# Patient Record
Sex: Female | Born: 1935 | Race: Black or African American | Hispanic: No | Smoking: Never smoker
Health system: Southern US, Community
[De-identification: ages and names within clinical notes are randomized; demographics above are authoritative.]

## PROBLEM LIST (undated history)

## (undated) DIAGNOSIS — C189 Malignant neoplasm of colon, unspecified: Secondary | ICD-10-CM

## (undated) DIAGNOSIS — R001 Bradycardia, unspecified: Secondary | ICD-10-CM

## (undated) DIAGNOSIS — N184 Chronic kidney disease, stage 4 (severe): Secondary | ICD-10-CM

## (undated) DIAGNOSIS — M109 Gout, unspecified: Secondary | ICD-10-CM

## (undated) DIAGNOSIS — Z950 Presence of cardiac pacemaker: Secondary | ICD-10-CM

## (undated) DIAGNOSIS — Z515 Encounter for palliative care: Secondary | ICD-10-CM

## (undated) DIAGNOSIS — K219 Gastro-esophageal reflux disease without esophagitis: Secondary | ICD-10-CM

## (undated) DIAGNOSIS — I5022 Chronic systolic (congestive) heart failure: Secondary | ICD-10-CM

## (undated) DIAGNOSIS — I2510 Atherosclerotic heart disease of native coronary artery without angina pectoris: Secondary | ICD-10-CM

## (undated) DIAGNOSIS — D509 Iron deficiency anemia, unspecified: Secondary | ICD-10-CM

## (undated) HISTORY — PX: PARTIAL COLECTOMY: SHX5273

## (undated) HISTORY — PX: ABDOMINAL HYSTERECTOMY: SHX81

---

## 2010-03-05 ENCOUNTER — Ambulatory Visit: Payer: Self-pay | Admitting: Cardiology

## 2010-03-05 ENCOUNTER — Inpatient Hospital Stay (HOSPITAL_COMMUNITY): Admission: EM | Admit: 2010-03-05 | Discharge: 2010-03-10 | Payer: Self-pay | Admitting: Emergency Medicine

## 2010-03-07 ENCOUNTER — Encounter: Payer: Self-pay | Admitting: Cardiology

## 2010-03-09 ENCOUNTER — Encounter: Payer: Self-pay | Admitting: Cardiology

## 2010-03-11 ENCOUNTER — Encounter: Payer: Self-pay | Admitting: Cardiology

## 2010-03-17 ENCOUNTER — Telehealth: Payer: Self-pay | Admitting: Cardiology

## 2010-03-27 ENCOUNTER — Encounter: Payer: Self-pay | Admitting: Cardiology

## 2010-04-08 ENCOUNTER — Encounter: Payer: Self-pay | Admitting: Cardiology

## 2010-05-10 ENCOUNTER — Encounter: Payer: Self-pay | Admitting: Cardiology

## 2010-05-23 ENCOUNTER — Inpatient Hospital Stay (HOSPITAL_COMMUNITY): Admission: EM | Admit: 2010-05-23 | Discharge: 2010-05-27 | Payer: Self-pay | Admitting: Emergency Medicine

## 2010-05-23 ENCOUNTER — Ambulatory Visit: Payer: Self-pay | Admitting: Vascular Surgery

## 2010-05-23 ENCOUNTER — Encounter (INDEPENDENT_AMBULATORY_CARE_PROVIDER_SITE_OTHER): Payer: Self-pay | Admitting: Emergency Medicine

## 2010-06-01 ENCOUNTER — Inpatient Hospital Stay (HOSPITAL_COMMUNITY): Admission: EM | Admit: 2010-06-01 | Discharge: 2010-06-07 | Payer: Self-pay | Admitting: Emergency Medicine

## 2010-06-03 ENCOUNTER — Ambulatory Visit: Payer: Self-pay | Admitting: Surgery

## 2010-06-03 ENCOUNTER — Encounter (INDEPENDENT_AMBULATORY_CARE_PROVIDER_SITE_OTHER): Payer: Self-pay | Admitting: Internal Medicine

## 2010-07-09 ENCOUNTER — Ambulatory Visit: Payer: Self-pay | Admitting: Cardiology

## 2010-07-09 ENCOUNTER — Inpatient Hospital Stay (HOSPITAL_COMMUNITY): Admission: EM | Admit: 2010-07-09 | Discharge: 2010-07-13 | Payer: Self-pay | Admitting: Emergency Medicine

## 2010-07-09 ENCOUNTER — Ambulatory Visit: Payer: Self-pay | Admitting: Internal Medicine

## 2010-07-11 ENCOUNTER — Ambulatory Visit: Payer: Self-pay | Admitting: Vascular Surgery

## 2010-07-11 ENCOUNTER — Encounter: Payer: Self-pay | Admitting: Internal Medicine

## 2010-07-15 ENCOUNTER — Encounter: Payer: Self-pay | Admitting: Cardiology

## 2010-07-25 ENCOUNTER — Telehealth: Payer: Self-pay | Admitting: Cardiology

## 2010-07-29 ENCOUNTER — Telehealth: Payer: Self-pay | Admitting: Cardiology

## 2010-08-02 ENCOUNTER — Encounter: Payer: Self-pay | Admitting: Cardiology

## 2010-08-03 ENCOUNTER — Telehealth: Payer: Self-pay | Admitting: Cardiology

## 2010-08-09 ENCOUNTER — Encounter: Payer: Self-pay | Admitting: Cardiology

## 2010-08-10 ENCOUNTER — Telehealth: Payer: Self-pay | Admitting: Cardiology

## 2010-08-24 DIAGNOSIS — I2510 Atherosclerotic heart disease of native coronary artery without angina pectoris: Secondary | ICD-10-CM

## 2010-08-25 ENCOUNTER — Encounter: Payer: Self-pay | Admitting: Cardiology

## 2010-08-26 ENCOUNTER — Ambulatory Visit: Payer: Self-pay | Admitting: Cardiology

## 2010-08-26 DIAGNOSIS — I498 Other specified cardiac arrhythmias: Secondary | ICD-10-CM | POA: Insufficient documentation

## 2010-08-26 DIAGNOSIS — I10 Essential (primary) hypertension: Secondary | ICD-10-CM

## 2010-08-26 DIAGNOSIS — R609 Edema, unspecified: Secondary | ICD-10-CM

## 2010-08-30 ENCOUNTER — Inpatient Hospital Stay (HOSPITAL_COMMUNITY)
Admission: EM | Admit: 2010-08-30 | Discharge: 2010-09-08 | Payer: Self-pay | Source: Home / Self Care | Admitting: Emergency Medicine

## 2010-08-30 ENCOUNTER — Ambulatory Visit: Payer: Self-pay | Admitting: Cardiology

## 2010-11-25 ENCOUNTER — Ambulatory Visit (HOSPITAL_COMMUNITY)
Admission: RE | Admit: 2010-11-25 | Discharge: 2010-11-25 | Payer: Self-pay | Source: Home / Self Care | Attending: Internal Medicine | Admitting: Internal Medicine

## 2011-01-05 NOTE — Discharge Summary (Addendum)
Laura Houston, Laura Houston NO.:  [id number]  MEDICAL RECORD NO.:  [id number]           PATIENT TYPE:  LOCATION:                                 FACILITY:  PHYSICIAN:  Minus Breeding, MD, FACCDATE OF BIRTH:  1923-05-23  DATE OF ADMISSION:  03/05/2010 DATE OF DISCHARGE:  03/10/2010                              DISCHARGE SUMMARY   PRIMARY CARDIOLOGIST:  Minus Breeding, MD, [facility]  PRIMARY CARE PHYSICIAN:  Nadeen Landau, MD  DISCHARGE DIAGNOSES: 1. Coronary artery disease.     a.     Cardiac catheterization, January 09, 2010:  Coronary artery      disease with chronic total occlusion of the mid left anterior      descending, 70-80% stenosis of the first marginal branch of the      circumflex artery, chronic total occlusion of the second marginal      branch of the circumflex artery, 30% proximal and 30% distal      stenosis in the right coronary artery.  Significant CAD, but well      collateralized and lesions are not favorable for PCI.  Medical      therapy recommended.     b.     A 2-D echocardiogram March 07, 2010:  Abnormal septal motion      and mildly dilated left ventricular cavity with mild left      ventricular hypertrophy and normal systolic function with left      ventricular ejection fraction 55%.  Moderate calcification of the      aortic valve between the left and noncoronary commissure, mild      regurgitation as well.  Mild dilatation of the aorta/sinus and      Valsalva and aortic root.  Mildly dilated left atrium.  Atrial      septal aneurysm not well visualized.  SECONDARY DIAGNOSES: 1. Gout. 2. Hypertension for years. 3. Gastroesophageal reflux disease. 4. Nervous/depression. 5. History of colon cancer.     a.     Status post hemicolectomy.     b.     Status post hysterectomy.     c.     Status post cervical spine surgery.  ALLERGIES AND INTOLERANCES: 1. PENICILLIN (nausea/vomiting). 2. SULFA (rash).  PROCEDURE: 1. EKG, March 05, 2010:  NSR, question old inferior infarct, LAD, poor     anterior R-wave progression but no acute ST-T-wave changes. 2. Chest x-ray, March 05, 2010:  Cardiomegaly.  No active disease. 3. CT angiography of the chest with contrast, March 05, 2010:  No     evidence of pulmonary embolus.  Cardiomegaly, CAD.  Question of     biliary ductal dilatation.  Right base atelectasis. 4. A 2-D echocardiogram, March 07, 2010:  Please see discharge     diagnoses, #1 subsection B. 5. Cardiac catheterization, March 09, 2010:  Please see discharge     diagnoses section, #1 subsection A.  HISTORY OF PRESENT ILLNESS:  [name] is a lovely 77 year old African American female with no prior cardiac history but history as outlined above who was evaluated by attending cardiologist, Dr. Minus Breeding on March 05, 2010 in the emergency department after acute shortness of breath.  The patient was in her usual state of health until she was watching TV with her family (she is visiting from out of town) when she noticed acute onset of shortness of breath.  She did not describe any chest pressure, neck or arm discomfort.  She also denies PND and orthopnea. She has had a mild nonproductive cough but no fevers or chills.  She denies any lower extremity edema.  Because of her complaints, she was brought to [facility] ED for further evaluation.  Cardiac enzymes were minimally elevated with CK of 181, MB of 8.4 and fraction of 4.6 and troponin-I of 0.09.  D-dimer elevated at 2.55 but CT of the chest showed no pulmonary embolism.  There was tortuosity of the thoracic aorta, coronary artery complications, and there was a question of biliary ductal dilatation.  Otherwise the patient is doing fairly well and she lives on her own with nearby family.  She still drives and ambulates to the grocery store. She has not been having any chest discomfort, neck or arm discomfort. She denies palpitations, pre/syncope, PND,  orthopnea.  HOSPITAL COURSE:  The patient was admitted and cardiac enzymes were minimally elevated and the decision was made by Dr. Percival Spanish that the patient should have a 2-D echocardiogram and if there was abnormal wall motion, should proceed with diagnostic left heart catheterization.  The patient did have a 2-D echocardiogram completed on the 28th and it did show abnormal septal motion.  It was determination then that the recommendation for cardiac catheterization should be made.  The patient and her family wanted to think about this and on the evening of March 08, 2010, they agreed to proceed with diagnostic LHC.  The patient was hydrated in anticipation of cardiac catheterization which was completed on March 09, 2010.  She tolerated this procedure well without significant complications.  She was noted to have significant coronary artery disease as outlined in the discharge diagnoses section, #1 subsection A but as described above was not favorable to interventions and therefore medical management is going to be pursued.  She was kept overnight for observation and seen by her new, primary cardiologist, Dr. Minus Breeding.  He deemed her stable for discharge and made some adjustments to her already nearly optimal medical management inpatient. She was instructed to follow up with her primary care [name] as well as given the option to set up followup appointment at [facility] office in the future.  At the time of discharge, the patient was given her new medication list, prescriptions, followup instructions, and post cath instructions.  All of her questions and concerns were addressed prior to leaving the hospital.  DISCHARGE LABS:  WBC 2.6, HGB 11.1, HCT 33.4, PLT count is 173,000. Sodium 135, potassium 4.3, chloride 102, bicarb 24, BUN 12 with a creatinine of 1.27, glucose 95, calcium 9.3.  FOLLOWUP PLANS AND APPOINTMENTS: 1. Primary care doctor in the next 1-2 weeks. 2.  [place] on as needed basis.  DISCHARGE MEDICATIONS:  Unable to obtain full medication list at this time as discharge summary is being completed at a later date, known to be on Toprol-XL 25 mg p.o. daily, Imdur 30 mg p.o. daily, Norvasc 5 mg p.o. daily, and p.r.n. sublingual nitroglycerin for chest discomfort.  No ACE inhibitor therapy was prescribed at the time of discharge.  DURATION OF DISCHARGE ENCOUNTER:  Including physician time 35 minutes.     Guss Bunde, [facility]  ______________________________ Minus Breeding, MD, [facility]    MS/MEDQ  D:  12/29/2010  T:  12/30/2010  Job:  [id number]  Electronically Signed by Guss Bunde PAC on 01/03/2011 07:39:25 PM Electronically Signed by Minus Breeding MD [facility] on 01/05/2011 12:24:41 PM

## 2011-01-10 NOTE — Letter (Signed)
Summary: Laura Houston Medication Issue Communication/Order  Laura Houston Medication Issue Communication/Order   Imported By: Sallee Provencal 05/25/2010 09:23:00  _____________________________________________________________________  External Attachment:    Type:   Image     Comment:   External Document

## 2011-01-10 NOTE — Letter (Signed)
Summary: [facility]   Imported By: Marilynne Drivers 09/12/2010 15:16:11  _____________________________________________________________________  External Attachment:    Type:   Image     Comment:   External Document

## 2011-01-10 NOTE — Miscellaneous (Signed)
Summary: [place] Discharge Note  [facility] Discharge Note   Imported By: Sallee Provencal 06/01/2010 10:13:22  _____________________________________________________________________  External Attachment:    Type:   Image     Comment:   External Document

## 2011-01-10 NOTE — Assessment & Plan Note (Signed)
Summary: EPH   Visit Type:  Follow-up Primary Provider:  Dr. Manual Meier  CC:  Edema.  History of Present Illness: The patient presents for followup of lower extremity edema, bradycardia. Since I last saw her she has had increasing lower extremity edema. She was on steroids and we thought this might have precipitated it. She's also had problems apparently with her blood pressure. I reviewed her hospital records at which time she was bradycardic. She was sent home off of beta blockers. However, at some point these were restarted. Today she's got a junctional rhythm of 40. Her systolic blood pressure is 100. She has significant lower extremity edema. She is quite somnolent. There have not been any acute episodes of shortness of breath per her daughter. Not describing PND or orthopnea. There's been no further syncopal episodes or chest pain. She is in a wheelchair apparently with her legs down most of the time. They stopped wearing compression stockings because they were knee-high and binding below the knee.  Current Medications (verified): 1)  Prilosec 20 Mg Cpdr (Omeprazole) .Marland Kitchen.. 1 By Mouth Daily 2)  Aspirin 81 Mg  Tabs (Aspirin) .Marland Kitchen.. 1 By Mouth Daily 3)  Norvasc 10 Mg Tabs (Amlodipine Besylate) .Marland Kitchen.. 1 By Mouth Daily 4)  Colace 100 Mg Caps (Docusate Sodium) .Marland Kitchen.. 1 By Mouth Daily 5)  Imdur 60 Mg Xr24h-Tab (Isosorbide Mononitrate) .Marland Kitchen.. 1 By Mouth Daily 6)  Citroma 1.745 Gm/84ml Soln (Magnesium Citrate) .... As Directed 7)  Prednisone 5 Mg Tabs (Prednisone) .... As Directed 8)  Metoprolol Tartrate 25 Mg Tabs (Metoprolol Tartrate) .Marland Kitchen.. 1 By Mouth Two Times A Day 9)  Hydralazine Hcl 25 Mg Tabs (Hydralazine Hcl) .Marland Kitchen.. 1 By Mouth Three Times A Day 10)  Tramadol Hcl 50 Mg Tabs (Tramadol Hcl) .Marland Kitchen.. 1 By Mouth Two Times A Day 11)  Depakote 125 Mg Tbec (Divalproex Sodium) .Marland Kitchen.. 1 By Mouth Two Times A Day 12)  Coumadin 3 Mg Tabs (Warfarin Sodium) .... As Directed 13)  Exelon 4.6 Mg/24hr Pt24 (Rivastigmine) ....  As Directed 14)  Macrobid 100 Mg Caps (Nitrofurantoin Monohyd Macro) .Marland Kitchen.. 1 By Mouth Two Times A Day X 2 Weeks 15)  Restoril 15 Mg Caps (Temazepam) .... As Needed 16)  Norco 10-325 Mg Tabs (Hydrocodone-Acetaminophen) .... As Needed 17)  Acetaminophen 325 Mg  Tabs (Acetaminophen) .... As Needed 18)  Cetirizine Hcl 10 Mg Tabs (Cetirizine Hcl) .... As Needed 19)  Klonopin 0.5 Mg Tabs (Clonazepam) .Marland Kitchen.. 1 By Mouth Two Times A Day 20)  Furosemide 40 Mg Tabs (Furosemide) .Marland Kitchen.. 1 By Mouth Daily 21)  Klor-Con 10 10 Meq Cr-Tabs (Potassium Chloride) .Marland Kitchen.. 1 By Mouth Dialy  Allergies (verified): 1)  ! Penicillin 2)  ! Sulfa  Past History:  Past Medical History: Last updated: 08/24/2010  She has known coronary artery disease by cardiac   catheterization on March 10, 2010.  She has essentially a totally   occluded mid LAD, 70% to 80% stenosis in the first marginal and the   circumflex, chronic total occlusion of second marginal, 30 and 30 in the   distal right.  She has good LV systolic function based on 2-D   echocardiogram.  She has been treated medically.      Past Surgical History:  1. Hemicolectomy.   2. Hysterectomy.   3. Cervical spine surgery.      Review of Systems       As stated in the HPI and negative for all other systems.   Vital Signs:  Patient  profile:   77 year old female Height:      64 inches Pulse rate:   40 / minute Resp:     18 per minute BP sitting:   100 / 58  (right arm)  Vitals Entered By: Levora Angel, CNA (August 26, 2010 9:37 AM)  Physical Exam  General:  Somnolent, arousable, in no distress Head:  normocephalic and atraumatic Eyes:  PERRLA/EOM intact; conjunctiva and lids normal. Neck:  No jugular venous distention at 90 Chest Wall:  no deformities or breast masses noted Lungs:  bilateral basilar crackles without wheezing Heart:  PMI not appreciated, S1 and S2 within normal limits, no S3, no S4 Abdomen:  Positive bowel sounds, no rebound, no  guarding, unable to appreciate organomegaly or midline pulsatile masses the patient seated Msk:  diffuse muscle wasting Pulses:  unable appreciate lower extremity pulses, her hands are in mittens unable appreciate radials Extremities:  severe lower extremity edema Cervical Nodes:  no significant adenopathy Psych:  somnolent but arousable   EKG  Procedure date:  08/26/2010  Findings:      Junctional rhythm rate 40, leftward axis, no acute ST-T wave changes  Impression & Recommendations:  Problem # 1:  CAD (ICD-414.00) We are pursuing medical management and risk reduction but no aggressive therapies. Orders: EKG w/ Interpretation (93000)  Problem # 2:  HYPERTENSION, MILD (ICD-401.1) Her blood pressures actually running low. I will change her meds as listed.  Problem # 3:  BRADYCARDIA (ICD-427.89) I discussed this with her primary physician. We will stop beta blockers and avoid this drug going forward.  Problem # 4:  EDEMA (ICD-782.3) This is the most significant problem obvious on physical exam today. She has renal insufficiency so we have to be careful about diuresis. I have written an order for thigh-high compression stockings to be placed daily by the staff and removed at night. I have reviewed with her daughter the importance of keeping her feet elevated.  Patient Instructions: 1)  Your physician recommends that you schedule a follow-up appointment in: 4 months with Dr Percival Spanish 2)  Your physician has recommended you make the following change in your medication: Stop Metoprolol 3)  Apply thigh high compression stocking daily 4)  Keep feet and legs elevated

## 2011-01-10 NOTE — Progress Notes (Signed)
Summary: OK to take med ordered by primary MD  Phone Note From Other Clinic   Caller: [facility] [place] office [phone] Request: Talk with Nurse Summary of Call: pcp wants pt to start on ambien 10 mg as needed every hs. then .25 mg xantax every 8 hrs for aniexty. per pt dtr request.  Initial call taken by: Neil Crouch,  July 29, 2010 12:36 PM  Follow-up for Phone Call        aware Dr Percival Spanish not in the office today to give approval for medications.    Will review with him and call back on Monday Follow-up by: Sim Boast, RN,  July 29, 2010 3:44 PM  Additional Follow-up for Phone Call Additional follow up Details #1::        Ok to take meds as ordered by primary MD Additional Follow-up by: Minus Breeding, MD, [facility],  July 31, 2010 11:17 AM    Additional Follow-up for Phone Call Additional follow up Details #2::    Sharyn Lull aware of oj to take meds ordered by primary MD Follow-up by: Sim Boast, RN,  August 01, 2010 11:42 AM

## 2011-01-10 NOTE — Letter (Signed)
Summary: Laura Houston Physicians Office Visit Note   [facility] Office Visit Note   Imported By: Sallee Provencal 09/09/2010 11:44:33  _____________________________________________________________________  External Attachment:    Type:   Image     Comment:   External Document

## 2011-01-10 NOTE — Progress Notes (Signed)
Summary: c/o swelling in lower extermities  Phone Note From Other Clinic   Caller: michelle jone from [facility] [phone]  Request: Talk with Nurse Summary of Call: c/o swelling in lower extermites, no cp, sob.  presidone was d/c last week.  Initial call taken by: Neil Crouch,  August 10, 2010 12:26 PM  Follow-up for Phone Call        Per Dr Percival Spanish, pt needs to be seen in the office for eval before he can make any further medication changes.  Spoke with nurse at facility who is aware and who has scheduled an appointment for the pt to be seen in follow up.  Nurse is aware the MD at the nursing facility should be making daily medical changes since he actually gets to see and evaluate her.  Nurse is in agreement.  She also states family is wanting for to start Klonopin for anxiety, this was referred to the primary md Follow-up by: Sim Boast, RN,  August 10, 2010 4:35 PM

## 2011-01-10 NOTE — Progress Notes (Signed)
Summary: ok to start Prednisone  Phone Note From Other Clinic   Caller: Nurse Lucina Mellow Summary of Call: Per Sharyn Lull from [facility] pt needs to go on Prednisone and family want agree until Dr Percival Spanish says it is ok. ofc [id number]  Initial call taken by: Lorraine Lax,  July 25, 2010 3:51 PM  Follow-up for Phone Call        Called pt's daughter Vaughan Basta....she does not want the assisted living facility to start any medication unless Dr.Hochrein approves this first. Advised her that he is not working  in this office this week but will get a message to him. I called [facility] and spoke with Sharyn Lull, the nurse taking care of her. ([phone]). She tells me that the patient has some gout in the hand and feet and the MD at [facility] wants to start her on Prednisone 20 mg every day with a tapered dose along with Ultram 50mg  every 12 hours. Advied will call her back after Dr.Hochrein reviews. Follow-up by: Georgetta Haber RN  Additional Follow-up for Phone Call Additional follow up Details #1::        OK to start prednisone Additional Follow-up by: Minus Breeding, MD, [facility],  July 25, 2010 4:58 PM    Additional Follow-up for Phone Call Additional follow up Details #2::    Sharyn Lull aware OK to start medication per Dr Percival Spanish Follow-up by: Sim Boast, RN,  July 26, 2010 11:18 AM

## 2011-01-10 NOTE — Miscellaneous (Signed)
Summary: [facility] Documentation of Face to Face Encounter   [facility] Documentation of Face to Face Encounter   Imported By: Sallee Provencal 06/27/2010 13:26:17  _____________________________________________________________________  External Attachment:    Type:   Image     Comment:   External Document

## 2011-01-10 NOTE — Miscellaneous (Signed)
Summary: [facility] Certification/Care Plan   Imported By: Sallee Provencal 05/11/2010 12:01:00  _____________________________________________________________________  External Attachment:    Type:   Image     Comment:   External Document

## 2011-01-10 NOTE — Progress Notes (Signed)
Summary: wants prednisone stopped  Phone Note Other Incoming   Caller: daughter Details for Reason: Orvis Brill Summary of Call: daughter thinks prednisone needs to be stopped due to swelling.    [place] wing  Will review with Dr Percival Spanish and call with orders Initial call taken by: Sim Boast, RN,  August 03, 2010 9:20 AM  Follow-up for Phone Call        Sharyn Lull called from nursing home with abnormal labs that she is going to fax for Dr Hochrein's review.  The MD that ordered the lab has been made aware of results per [facility].  Dr Percival Spanish gave verbal orders for pt to take predisone 10 mg for 2 day, 5mg  for 2 days and then discontinue.  Orders given to [facility] who repeated them correctly. Pt will follow up at appt 08/26/2010 @ 9:45 Follow-up by: Sim Boast, RN,  August 03, 2010 10:13 AM

## 2011-01-10 NOTE — Letter (Signed)
Summary: Addendum to Physician Plan of Treatment  Addendum to Physician Plan of Treatment   Imported By: Marilynne Drivers 10/31/2010 10:31:57  _____________________________________________________________________  External Attachment:    Type:   Image     Comment:   External Document

## 2011-01-10 NOTE — Progress Notes (Signed)
Summary: pt needs PT  Phone Note From Other Clinic Call back at [phone]   Caller: Lenna Sciara / [facility] Request: Talk with Nurse, Talk with Provider Summary of Call: Lenna Sciara needs a order for patient to have PT for strenghthning she is very weak. Dr. Percival Spanish saw pt in hospital that is why she is calling Initial call taken by: Shelda Pal,  March 17, 2010 9:52 AM  Follow-up for Phone Call        Called patient and left message on machine per Dr Percival Spanish, pt may have PT d/t weakness.  Melissa to call back if she has any questions. Follow-up by: Sim Boast, RN,  March 17, 2010 1:26 PM     Appended Document: pt needs PT OK to order PT

## 2011-01-24 ENCOUNTER — Emergency Department (HOSPITAL_COMMUNITY): Payer: Medicare Other

## 2011-01-24 ENCOUNTER — Emergency Department (HOSPITAL_COMMUNITY)
Admission: EM | Admit: 2011-01-24 | Discharge: 2011-01-24 | Disposition: A | Discharge status: Home / Self Care | Payer: Medicare Other | Attending: Emergency Medicine | Admitting: Emergency Medicine

## 2011-01-24 DIAGNOSIS — R059 Cough, unspecified: Secondary | ICD-10-CM | POA: Insufficient documentation

## 2011-01-24 DIAGNOSIS — M1990 Unspecified osteoarthritis, unspecified site: Secondary | ICD-10-CM | POA: Insufficient documentation

## 2011-01-24 DIAGNOSIS — R5381 Other malaise: Secondary | ICD-10-CM | POA: Insufficient documentation

## 2011-01-24 DIAGNOSIS — R509 Fever, unspecified: Secondary | ICD-10-CM | POA: Insufficient documentation

## 2011-01-24 DIAGNOSIS — R51 Headache: Secondary | ICD-10-CM | POA: Insufficient documentation

## 2011-01-24 DIAGNOSIS — R630 Anorexia: Secondary | ICD-10-CM | POA: Insufficient documentation

## 2011-01-24 DIAGNOSIS — R05 Cough: Secondary | ICD-10-CM | POA: Insufficient documentation

## 2011-01-24 DIAGNOSIS — R5383 Other fatigue: Secondary | ICD-10-CM | POA: Insufficient documentation

## 2011-01-24 DIAGNOSIS — Z8639 Personal history of other endocrine, nutritional and metabolic disease: Secondary | ICD-10-CM | POA: Insufficient documentation

## 2011-01-24 DIAGNOSIS — F068 Other specified mental disorders due to known physiological condition: Secondary | ICD-10-CM | POA: Insufficient documentation

## 2011-01-24 DIAGNOSIS — I509 Heart failure, unspecified: Secondary | ICD-10-CM | POA: Insufficient documentation

## 2011-01-24 DIAGNOSIS — Z7982 Long term (current) use of aspirin: Secondary | ICD-10-CM | POA: Insufficient documentation

## 2011-01-24 DIAGNOSIS — Z862 Personal history of diseases of the blood and blood-forming organs and certain disorders involving the immune mechanism: Secondary | ICD-10-CM | POA: Insufficient documentation

## 2011-01-24 DIAGNOSIS — Z8711 Personal history of peptic ulcer disease: Secondary | ICD-10-CM | POA: Insufficient documentation

## 2011-01-24 DIAGNOSIS — I2510 Atherosclerotic heart disease of native coronary artery without angina pectoris: Secondary | ICD-10-CM | POA: Insufficient documentation

## 2011-01-24 DIAGNOSIS — I10 Essential (primary) hypertension: Secondary | ICD-10-CM | POA: Insufficient documentation

## 2011-01-24 DIAGNOSIS — R0602 Shortness of breath: Secondary | ICD-10-CM | POA: Insufficient documentation

## 2011-01-24 DIAGNOSIS — Z79899 Other long term (current) drug therapy: Secondary | ICD-10-CM | POA: Insufficient documentation

## 2011-01-24 DIAGNOSIS — F341 Dysthymic disorder: Secondary | ICD-10-CM | POA: Insufficient documentation

## 2011-01-24 LAB — BRAIN NATRIURETIC PEPTIDE: Pro B Natriuretic peptide (BNP): 284 pg/mL — ABNORMAL HIGH (ref 0.0–100.0)

## 2011-01-24 LAB — CBC
MCH: 24.3 pg — ABNORMAL LOW (ref 26.0–34.0)
MCHC: 32.2 g/dL (ref 30.0–36.0)
MCV: 75.5 fL — ABNORMAL LOW (ref 78.0–100.0)
Platelets: 230 10*3/uL (ref 150–400)
RBC: 4.12 MIL/uL (ref 3.87–5.11)
RDW: 15.3 % (ref 11.5–15.5)

## 2011-01-24 LAB — DIFFERENTIAL
Basophils Absolute: 0 K/uL (ref 0.0–0.1)
Basophils Relative: 0 % (ref 0–1)
Eosinophils Absolute: 0 K/uL (ref 0.0–0.7)
Eosinophils Relative: 1 % (ref 0–5)
Lymphocytes Relative: 4 % — ABNORMAL LOW (ref 12–46)
Lymphs Abs: 0.2 K/uL — ABNORMAL LOW (ref 0.7–4.0)
Monocytes Absolute: 0.2 K/uL (ref 0.1–1.0)
Monocytes Relative: 4 % (ref 3–12)
Neutro Abs: 4 K/uL (ref 1.7–7.7)
Neutrophils Relative %: 91 % — ABNORMAL HIGH (ref 43–77)

## 2011-01-24 LAB — URINALYSIS, ROUTINE W REFLEX MICROSCOPIC
Bilirubin Urine: NEGATIVE
Hgb urine dipstick: NEGATIVE
Ketones, ur: NEGATIVE mg/dL
Nitrite: NEGATIVE
Protein, ur: NEGATIVE mg/dL
Specific Gravity, Urine: 1.015 (ref 1.005–1.030)
Urine Glucose, Fasting: NEGATIVE mg/dL
Urobilinogen, UA: 0.2 mg/dL (ref 0.0–1.0)
pH: 6 (ref 5.0–8.0)

## 2011-01-24 LAB — BASIC METABOLIC PANEL
CO2: 25 mEq/L (ref 19–32)
Calcium: 9 mg/dL (ref 8.4–10.5)
GFR calc Af Amer: 38 mL/min — ABNORMAL LOW (ref >60)
Sodium: 137 mEq/L (ref 135–145)

## 2011-01-24 LAB — POCT CARDIAC MARKERS: Myoglobin, poc: 93.8 ng/mL (ref 12–200)

## 2011-02-23 LAB — URINALYSIS, ROUTINE W REFLEX MICROSCOPIC
Bilirubin Urine: NEGATIVE
Glucose, UA: NEGATIVE mg/dL
Protein, ur: 100 mg/dL — AB
Specific Gravity, Urine: 1.021 (ref 1.005–1.030)

## 2011-02-23 LAB — CARDIAC PANEL(CRET KIN+CKTOT+MB+TROPI)
CK, MB: 16.6 ng/mL (ref 0.3–4.0)
CK, MB: 2 ng/mL (ref 0.3–4.0)
Relative Index: 8.9 — ABNORMAL HIGH (ref 0.0–2.5)
Total CK: 56 U/L (ref 7–177)
Troponin I: 0.27 ng/mL — ABNORMAL HIGH (ref 0.00–0.06)

## 2011-02-23 LAB — CROSSMATCH

## 2011-02-23 LAB — BASIC METABOLIC PANEL
BUN: 17 mg/dL (ref 6–23)
BUN: 22 mg/dL (ref 6–23)
BUN: 23 mg/dL (ref 6–23)
CO2: 24 mEq/L (ref 19–32)
CO2: 26 mEq/L (ref 19–32)
CO2: 26 mEq/L (ref 19–32)
CO2: 28 mEq/L (ref 19–32)
CO2: 29 mEq/L (ref 19–32)
Calcium: 9.1 mg/dL (ref 8.4–10.5)
Calcium: 9.1 mg/dL (ref 8.4–10.5)
Calcium: 9.1 mg/dL (ref 8.4–10.5)
Chloride: 108 mEq/L (ref 96–112)
Chloride: 108 mEq/L (ref 96–112)
Chloride: 110 mEq/L (ref 96–112)
Chloride: 111 mEq/L (ref 96–112)
Chloride: 111 mEq/L (ref 96–112)
Creatinine, Ser: 1.31 mg/dL — ABNORMAL HIGH (ref 0.4–1.2)
Creatinine, Ser: 1.35 mg/dL — ABNORMAL HIGH (ref 0.4–1.2)
Creatinine, Ser: 1.45 mg/dL — ABNORMAL HIGH (ref 0.4–1.2)
GFR calc Af Amer: 37 mL/min — ABNORMAL LOW (ref >60)
GFR calc Af Amer: 39 mL/min — ABNORMAL LOW (ref >60)
GFR calc Af Amer: 42 mL/min — ABNORMAL LOW (ref >60)
GFR calc Af Amer: 45 mL/min — ABNORMAL LOW (ref >60)
GFR calc Af Amer: 47 mL/min — ABNORMAL LOW (ref >60)
GFR calc non Af Amer: 34 mL/min — ABNORMAL LOW (ref >60)
GFR calc non Af Amer: 36 mL/min — ABNORMAL LOW (ref >60)
GFR calc non Af Amer: 37 mL/min — ABNORMAL LOW (ref >60)
Glucose, Bld: 103 mg/dL — ABNORMAL HIGH (ref 70–99)
Glucose, Bld: 79 mg/dL (ref 70–99)
Glucose, Bld: 84 mg/dL (ref 70–99)
Potassium: 3.2 mEq/L — ABNORMAL LOW (ref 3.5–5.1)
Potassium: 3.6 mEq/L (ref 3.5–5.1)
Sodium: 139 mEq/L (ref 135–145)
Sodium: 140 mEq/L (ref 135–145)
Sodium: 140 mEq/L (ref 135–145)
Sodium: 141 mEq/L (ref 135–145)
Sodium: 141 mEq/L (ref 135–145)

## 2011-02-23 LAB — CBC
HCT: 25.9 % — ABNORMAL LOW (ref 36.0–46.0)
HCT: 28.4 % — ABNORMAL LOW (ref 36.0–46.0)
HCT: 29.9 % — ABNORMAL LOW (ref 36.0–46.0)
HCT: 31.3 % — ABNORMAL LOW (ref 36.0–46.0)
Hemoglobin: 10.2 g/dL — ABNORMAL LOW (ref 12.0–15.0)
Hemoglobin: 10.3 g/dL — ABNORMAL LOW (ref 12.0–15.0)
Hemoglobin: 10.3 g/dL — ABNORMAL LOW (ref 12.0–15.0)
Hemoglobin: 8.5 g/dL — ABNORMAL LOW (ref 12.0–15.0)
Hemoglobin: 8.6 g/dL — ABNORMAL LOW (ref 12.0–15.0)
Hemoglobin: 9.4 g/dL — ABNORMAL LOW (ref 12.0–15.0)
Hemoglobin: 9.7 g/dL — ABNORMAL LOW (ref 12.0–15.0)
MCH: 28.6 pg (ref 26.0–34.0)
MCH: 28.9 pg (ref 26.0–34.0)
MCH: 29.1 pg (ref 26.0–34.0)
MCH: 29.1 pg (ref 26.0–34.0)
MCH: 29.1 pg (ref 26.0–34.0)
MCHC: 33 g/dL (ref 30.0–36.0)
MCHC: 33.2 g/dL (ref 30.0–36.0)
MCV: 86.6 fL (ref 78.0–100.0)
MCV: 87.1 fL (ref 78.0–100.0)
MCV: 87.3 fL (ref 78.0–100.0)
MCV: 87.6 fL (ref 78.0–100.0)
MCV: 88.2 fL (ref 78.0–100.0)
MCV: 88.2 fL (ref 78.0–100.0)
MCV: 88.7 fL (ref 78.0–100.0)
Platelets: 272 10*3/uL (ref 150–400)
Platelets: 276 10*3/uL (ref 150–400)
Platelets: 288 10*3/uL (ref 150–400)
Platelets: 290 10*3/uL (ref 150–400)
Platelets: 293 10*3/uL (ref 150–400)
Platelets: 304 10*3/uL (ref 150–400)
Platelets: 306 10*3/uL (ref 150–400)
Platelets: 328 10*3/uL (ref 150–400)
RBC: 1.95 MIL/uL — ABNORMAL LOW (ref 3.87–5.11)
RBC: 2.55 MIL/uL — ABNORMAL LOW (ref 3.87–5.11)
RBC: 2.93 MIL/uL — ABNORMAL LOW (ref 3.87–5.11)
RBC: 3.17 MIL/uL — ABNORMAL LOW (ref 3.87–5.11)
RBC: 3.22 MIL/uL — ABNORMAL LOW (ref 3.87–5.11)
RBC: 3.29 MIL/uL — ABNORMAL LOW (ref 3.87–5.11)
RBC: 3.34 MIL/uL — ABNORMAL LOW (ref 3.87–5.11)
RBC: 3.37 MIL/uL — ABNORMAL LOW (ref 3.87–5.11)
RBC: 3.45 MIL/uL — ABNORMAL LOW (ref 3.87–5.11)
RBC: 3.53 MIL/uL — ABNORMAL LOW (ref 3.87–5.11)
RBC: 3.61 MIL/uL — ABNORMAL LOW (ref 3.87–5.11)
RDW: 16.2 % — ABNORMAL HIGH (ref 11.5–15.5)
RDW: 16.5 % — ABNORMAL HIGH (ref 11.5–15.5)
RDW: 16.6 % — ABNORMAL HIGH (ref 11.5–15.5)
RDW: 16.6 % — ABNORMAL HIGH (ref 11.5–15.5)
RDW: 16.7 % — ABNORMAL HIGH (ref 11.5–15.5)
WBC: 4.3 10*3/uL (ref 4.0–10.5)
WBC: 4.4 10*3/uL (ref 4.0–10.5)
WBC: 4.6 10*3/uL (ref 4.0–10.5)
WBC: 5.4 10*3/uL (ref 4.0–10.5)
WBC: 5.4 10*3/uL (ref 4.0–10.5)
WBC: 5.7 10*3/uL (ref 4.0–10.5)
WBC: 5.9 10*3/uL (ref 4.0–10.5)
WBC: 6.4 10*3/uL (ref 4.0–10.5)

## 2011-02-23 LAB — PROTIME-INR
INR: 1.26 (ref 0.00–1.49)
INR: 1.79 — ABNORMAL HIGH (ref 0.00–1.49)
Prothrombin Time: 16 seconds — ABNORMAL HIGH (ref 11.6–15.2)
Prothrombin Time: 17.2 seconds — ABNORMAL HIGH (ref 11.6–15.2)
Prothrombin Time: 21 seconds — ABNORMAL HIGH (ref 11.6–15.2)

## 2011-02-23 LAB — APTT
aPTT: 41 seconds — ABNORMAL HIGH (ref 24–37)
aPTT: 55 seconds — ABNORMAL HIGH (ref 24–37)

## 2011-02-23 LAB — CK TOTAL AND CKMB (NOT AT ARMC)
CK, MB: 5.2 ng/mL — ABNORMAL HIGH (ref 0.3–4.0)
Relative Index: 4 — ABNORMAL HIGH (ref 0.0–2.5)

## 2011-02-23 LAB — DIFFERENTIAL
Basophils Absolute: 0 10*3/uL (ref 0.0–0.1)
Basophils Relative: 0 % (ref 0–1)
Eosinophils Absolute: 0.1 10*3/uL (ref 0.0–0.7)
Eosinophils Relative: 1 % (ref 0–5)
Lymphs Abs: 1.1 10*3/uL (ref 0.7–4.0)
Monocytes Absolute: 0.5 10*3/uL (ref 0.1–1.0)
Monocytes Relative: 11 % (ref 3–12)
Neutro Abs: 3.9 10*3/uL (ref 1.7–7.7)
Neutrophils Relative %: 61 % (ref 43–77)

## 2011-02-23 LAB — PREPARE FRESH FROZEN PLASMA

## 2011-02-23 LAB — COMPREHENSIVE METABOLIC PANEL
ALT: 12 U/L (ref 0–35)
ALT: 13 U/L (ref 0–35)
ALT: 14 U/L (ref 0–35)
AST: 18 U/L (ref 0–37)
AST: 20 U/L (ref 0–37)
AST: 22 U/L (ref 0–37)
Albumin: 2.5 g/dL — ABNORMAL LOW (ref 3.5–5.2)
Albumin: 2.7 g/dL — ABNORMAL LOW (ref 3.5–5.2)
Alkaline Phosphatase: 51 U/L (ref 39–117)
Alkaline Phosphatase: 54 U/L (ref 39–117)
BUN: 61 mg/dL — ABNORMAL HIGH (ref 6–23)
CO2: 24 mEq/L (ref 19–32)
CO2: 27 mEq/L (ref 19–32)
Calcium: 8.6 mg/dL (ref 8.4–10.5)
Calcium: 8.6 mg/dL (ref 8.4–10.5)
Chloride: 104 mEq/L (ref 96–112)
Chloride: 107 mEq/L (ref 96–112)
Chloride: 108 mEq/L (ref 96–112)
GFR calc Af Amer: 19 mL/min — ABNORMAL LOW (ref >60)
GFR calc Af Amer: 26 mL/min — ABNORMAL LOW (ref >60)
GFR calc Af Amer: 29 mL/min — ABNORMAL LOW (ref >60)
GFR calc non Af Amer: 21 mL/min — ABNORMAL LOW (ref >60)
GFR calc non Af Amer: 24 mL/min — ABNORMAL LOW (ref >60)
Potassium: 4.4 mEq/L (ref 3.5–5.1)
Potassium: 5.7 mEq/L — ABNORMAL HIGH (ref 3.5–5.1)
Sodium: 139 mEq/L (ref 135–145)
Sodium: 140 mEq/L (ref 135–145)
Sodium: 142 mEq/L (ref 135–145)
Total Bilirubin: 0.3 mg/dL (ref 0.3–1.2)
Total Bilirubin: 0.6 mg/dL (ref 0.3–1.2)
Total Protein: 5.6 g/dL — ABNORMAL LOW (ref 6.0–8.3)

## 2011-02-23 LAB — URINE CULTURE
Colony Count: NO GROWTH
Culture  Setup Time: 201109220121
Culture  Setup Time: 201109261626
Special Requests: NEGATIVE
Special Requests: NEGATIVE

## 2011-02-23 LAB — CULTURE, BLOOD (ROUTINE X 2)
Culture  Setup Time: 201109262119
Culture: NO GROWTH

## 2011-02-23 LAB — LIPID PANEL
HDL: 53 mg/dL (ref >39)
LDL Cholesterol: 75 mg/dL (ref 0–99)
Total CHOL/HDL Ratio: 2.6 RATIO
VLDL: 12 mg/dL (ref 0–40)

## 2011-02-23 LAB — PREPARE RBC (CROSSMATCH)

## 2011-02-23 LAB — TROPONIN I: Troponin I: 1.26 ng/mL (ref 0.00–0.06)

## 2011-02-23 LAB — URINE MICROSCOPIC-ADD ON

## 2011-02-23 LAB — VALPROIC ACID LEVEL: Valproic Acid Lvl: 12.1 ug/mL — ABNORMAL LOW (ref 50.0–100.0)

## 2011-02-24 LAB — BASIC METABOLIC PANEL
BUN: 35 mg/dL — ABNORMAL HIGH (ref 6–23)
CO2: 26 mEq/L (ref 19–32)
CO2: 27 mEq/L (ref 19–32)
Chloride: 106 mEq/L (ref 96–112)
Chloride: 106 mEq/L (ref 96–112)
GFR calc non Af Amer: 35 mL/min — ABNORMAL LOW (ref >60)
GFR calc non Af Amer: 38 mL/min — ABNORMAL LOW (ref >60)
Glucose, Bld: 100 mg/dL — ABNORMAL HIGH (ref 70–99)
Glucose, Bld: 115 mg/dL — ABNORMAL HIGH (ref 70–99)
Glucose, Bld: 144 mg/dL — ABNORMAL HIGH (ref 70–99)
Potassium: 3.8 mEq/L (ref 3.5–5.1)
Potassium: 4.1 mEq/L (ref 3.5–5.1)
Potassium: 4.2 mEq/L (ref 3.5–5.1)
Sodium: 139 mEq/L (ref 135–145)
Sodium: 139 mEq/L (ref 135–145)

## 2011-02-24 LAB — CBC
HCT: 29.7 % — ABNORMAL LOW (ref 36.0–46.0)
MCH: 31.5 pg (ref 26.0–34.0)
MCHC: 33.9 g/dL (ref 30.0–36.0)
MCV: 92.8 fL (ref 78.0–100.0)
RDW: 18.9 % — ABNORMAL HIGH (ref 11.5–15.5)

## 2011-02-24 LAB — PROTIME-INR: INR: 1.74 — ABNORMAL HIGH (ref 0.00–1.49)

## 2011-02-24 LAB — URINALYSIS, ROUTINE W REFLEX MICROSCOPIC
Bilirubin Urine: NEGATIVE
Ketones, ur: NEGATIVE mg/dL
Nitrite: NEGATIVE
Protein, ur: NEGATIVE mg/dL
pH: 7.5 (ref 5.0–8.0)

## 2011-02-24 LAB — MAGNESIUM: Magnesium: 2 mg/dL (ref 1.5–2.5)

## 2011-02-24 LAB — URINE MICROSCOPIC-ADD ON

## 2011-02-25 LAB — TYPE AND SCREEN
ABO/RH(D): B POS
Antibody Screen: NEGATIVE

## 2011-02-25 LAB — POCT CARDIAC MARKERS: Myoglobin, poc: 100 ng/mL (ref 12–200)

## 2011-02-25 LAB — CARDIAC PANEL(CRET KIN+CKTOT+MB+TROPI)
CK, MB: 6.6 ng/mL (ref 0.3–4.0)
Total CK: 53 U/L (ref 7–177)
Troponin I: 0.07 ng/mL — ABNORMAL HIGH (ref 0.00–0.06)
Troponin I: 0.08 ng/mL — ABNORMAL HIGH (ref 0.00–0.06)

## 2011-02-25 LAB — CBC
Hemoglobin: 10.2 g/dL — ABNORMAL LOW (ref 12.0–15.0)
MCH: 31.1 pg (ref 26.0–34.0)
MCHC: 33.5 g/dL (ref 30.0–36.0)
MCHC: 33.9 g/dL (ref 30.0–36.0)
RDW: 18.4 % — ABNORMAL HIGH (ref 11.5–15.5)
RDW: 19.4 % — ABNORMAL HIGH (ref 11.5–15.5)

## 2011-02-25 LAB — DIFFERENTIAL
Basophils Absolute: 0 10*3/uL (ref 0.0–0.1)
Basophils Relative: 0 % (ref 0–1)
Neutro Abs: 4.6 10*3/uL (ref 1.7–7.7)
Neutrophils Relative %: 75 % (ref 43–77)

## 2011-02-25 LAB — URINALYSIS, ROUTINE W REFLEX MICROSCOPIC
Glucose, UA: NEGATIVE mg/dL
Hgb urine dipstick: NEGATIVE
Protein, ur: NEGATIVE mg/dL
pH: 7 (ref 5.0–8.0)

## 2011-02-25 LAB — URINE MICROSCOPIC-ADD ON

## 2011-02-25 LAB — BASIC METABOLIC PANEL
Calcium: 9 mg/dL (ref 8.4–10.5)
GFR calc Af Amer: 37 mL/min — ABNORMAL LOW (ref >60)
GFR calc non Af Amer: 31 mL/min — ABNORMAL LOW (ref >60)
Glucose, Bld: 134 mg/dL — ABNORMAL HIGH (ref 70–99)
Potassium: 4.8 mEq/L (ref 3.5–5.1)
Sodium: 141 mEq/L (ref 135–145)

## 2011-02-25 LAB — PROTIME-INR
INR: 3.16 — ABNORMAL HIGH (ref 0.00–1.49)
INR: 3.26 — ABNORMAL HIGH (ref 0.00–1.49)
Prothrombin Time: 32.2 seconds — ABNORMAL HIGH (ref 11.6–15.2)
Prothrombin Time: 33 seconds — ABNORMAL HIGH (ref 11.6–15.2)

## 2011-02-25 LAB — POCT I-STAT 3, ART BLOOD GAS (G3+)
Patient temperature: 98
pH, Arterial: 7.433 — ABNORMAL HIGH (ref 7.350–7.400)

## 2011-02-25 LAB — POCT I-STAT, CHEM 8
Glucose, Bld: 165 mg/dL — ABNORMAL HIGH (ref 70–99)
HCT: 29 % — ABNORMAL LOW (ref 36.0–46.0)
Hemoglobin: 9.9 g/dL — ABNORMAL LOW (ref 12.0–15.0)
Potassium: 4.2 mEq/L (ref 3.5–5.1)
Sodium: 136 mEq/L (ref 135–145)
TCO2: 24 mmol/L (ref 0–100)

## 2011-02-25 LAB — URINE CULTURE: Colony Count: 3000

## 2011-02-25 LAB — GLUCOSE, CAPILLARY: Glucose-Capillary: 174 mg/dL — ABNORMAL HIGH (ref 70–99)

## 2011-02-25 LAB — MRSA PCR SCREENING: MRSA by PCR: NEGATIVE

## 2011-02-25 LAB — BRAIN NATRIURETIC PEPTIDE: Pro B Natriuretic peptide (BNP): 382 pg/mL — ABNORMAL HIGH (ref 0.0–100.0)

## 2011-02-25 LAB — CK TOTAL AND CKMB (NOT AT ARMC): Total CK: 47 U/L (ref 7–177)

## 2011-02-25 LAB — APTT: aPTT: 32 seconds (ref 24–37)

## 2011-02-26 LAB — BASIC METABOLIC PANEL
BUN: 24 mg/dL — ABNORMAL HIGH (ref 6–23)
BUN: 30 mg/dL — ABNORMAL HIGH (ref 6–23)
BUN: 31 mg/dL — ABNORMAL HIGH (ref 6–23)
BUN: 32 mg/dL — ABNORMAL HIGH (ref 6–23)
BUN: 41 mg/dL — ABNORMAL HIGH (ref 6–23)
BUN: 17 mg/dL (ref 6–23)
CO2: 26 mEq/L (ref 19–32)
CO2: 26 mEq/L (ref 19–32)
CO2: 27 mEq/L (ref 19–32)
CO2: 27 mEq/L (ref 19–32)
CO2: 29 mEq/L (ref 19–32)
Calcium: 8.8 mg/dL (ref 8.4–10.5)
Calcium: 9.1 mg/dL (ref 8.4–10.5)
Calcium: 9.2 mg/dL (ref 8.4–10.5)
Chloride: 100 mEq/L (ref 96–112)
Chloride: 102 mEq/L (ref 96–112)
Chloride: 97 mEq/L (ref 96–112)
Chloride: 97 mEq/L (ref 96–112)
Chloride: 97 mEq/L (ref 96–112)
Chloride: 100 mEq/L (ref 96–112)
Creatinine, Ser: 1.47 mg/dL — ABNORMAL HIGH (ref 0.4–1.2)
Creatinine, Ser: 1.54 mg/dL — ABNORMAL HIGH (ref 0.4–1.2)
Creatinine, Ser: 1.34 mg/dL — ABNORMAL HIGH (ref 0.4–1.2)
GFR calc Af Amer: 28 mL/min — ABNORMAL LOW (ref >60)
GFR calc Af Amer: 41 mL/min — ABNORMAL LOW (ref >60)
GFR calc Af Amer: 54 mL/min — ABNORMAL LOW (ref >60)
GFR calc Af Amer: 45 mL/min — ABNORMAL LOW (ref >60)
GFR calc non Af Amer: 31 mL/min — ABNORMAL LOW (ref >60)
GFR calc non Af Amer: 44 mL/min — ABNORMAL LOW (ref >60)
GFR calc non Af Amer: 38 mL/min — ABNORMAL LOW (ref >60)
Glucose, Bld: 106 mg/dL — ABNORMAL HIGH (ref 70–99)
Glucose, Bld: 136 mg/dL — ABNORMAL HIGH (ref 70–99)
Glucose, Bld: 155 mg/dL — ABNORMAL HIGH (ref 70–99)
Glucose, Bld: 195 mg/dL — ABNORMAL HIGH (ref 70–99)
Glucose, Bld: 88 mg/dL (ref 70–99)
Potassium: 3.4 mEq/L — ABNORMAL LOW (ref 3.5–5.1)
Potassium: 3.4 mEq/L — ABNORMAL LOW (ref 3.5–5.1)
Potassium: 3.6 mEq/L (ref 3.5–5.1)
Potassium: 5.4 mEq/L — ABNORMAL HIGH (ref 3.5–5.1)
Potassium: 5.9 mEq/L — ABNORMAL HIGH (ref 3.5–5.1)
Potassium: 6 mEq/L — ABNORMAL HIGH (ref 3.5–5.1)
Potassium: 3.5 mEq/L (ref 3.5–5.1)
Sodium: 131 mEq/L — ABNORMAL LOW (ref 135–145)
Sodium: 135 mEq/L (ref 135–145)
Sodium: 137 mEq/L (ref 135–145)

## 2011-02-26 LAB — COMPREHENSIVE METABOLIC PANEL
ALT: 26 U/L (ref 0–35)
AST: 35 U/L (ref 0–37)
AST: 70 U/L — ABNORMAL HIGH (ref 0–37)
BUN: 26 mg/dL — ABNORMAL HIGH (ref 6–23)
BUN: 13 mg/dL (ref 6–23)
BUN: 15 mg/dL (ref 6–23)
CO2: 29 mEq/L (ref 19–32)
CO2: 30 mEq/L (ref 19–32)
CO2: 31 mEq/L (ref 19–32)
Calcium: 8.9 mg/dL (ref 8.4–10.5)
Chloride: 95 mEq/L — ABNORMAL LOW (ref 96–112)
Chloride: 99 mEq/L (ref 96–112)
Creatinine, Ser: 1.9 mg/dL — ABNORMAL HIGH (ref 0.4–1.2)
Creatinine, Ser: 1.37 mg/dL — ABNORMAL HIGH (ref 0.4–1.2)
Creatinine, Ser: 1.46 mg/dL — ABNORMAL HIGH (ref 0.4–1.2)
GFR calc Af Amer: 41 mL/min — ABNORMAL LOW (ref >60)
GFR calc non Af Amer: 25 mL/min — ABNORMAL LOW (ref >60)
GFR calc non Af Amer: 34 mL/min — ABNORMAL LOW (ref >60)
Glucose, Bld: 87 mg/dL (ref 70–99)
Potassium: 3 mEq/L — ABNORMAL LOW (ref 3.5–5.1)
Total Bilirubin: 0.7 mg/dL (ref 0.3–1.2)
Total Bilirubin: 1 mg/dL (ref 0.3–1.2)

## 2011-02-26 LAB — POCT I-STAT 3, ART BLOOD GAS (G3+)
Acid-Base Excess: 1 mmol/L (ref 0.0–2.0)
pCO2 arterial: 39.6 mmHg (ref 35.0–45.0)
pH, Arterial: 7.423 — ABNORMAL HIGH (ref 7.350–7.400)
pO2, Arterial: 76 mmHg — ABNORMAL LOW (ref 80.0–100.0)

## 2011-02-26 LAB — CBC
HCT: 30.1 % — ABNORMAL LOW (ref 36.0–46.0)
HCT: 30.2 % — ABNORMAL LOW (ref 36.0–46.0)
HCT: 28.9 % — ABNORMAL LOW (ref 36.0–46.0)
HCT: 29.2 % — ABNORMAL LOW (ref 36.0–46.0)
HCT: 29.5 % — ABNORMAL LOW (ref 36.0–46.0)
Hemoglobin: 10.1 g/dL — ABNORMAL LOW (ref 12.0–15.0)
Hemoglobin: 10.1 g/dL — ABNORMAL LOW (ref 12.0–15.0)
Hemoglobin: 9.7 g/dL — ABNORMAL LOW (ref 12.0–15.0)
MCHC: 32.3 g/dL (ref 30.0–36.0)
MCV: 88.5 fL (ref 78.0–100.0)
MCV: 89.1 fL (ref 78.0–100.0)
MCV: 85.7 fL (ref 78.0–100.0)
MCV: 88.7 fL (ref 78.0–100.0)
MCV: 89 fL (ref 78.0–100.0)
Platelets: 252 10*3/uL (ref 150–400)
RBC: 3.41 MIL/uL — ABNORMAL LOW (ref 3.87–5.11)
RBC: 3.28 MIL/uL — ABNORMAL LOW (ref 3.87–5.11)
RBC: 3.33 MIL/uL — ABNORMAL LOW (ref 3.87–5.11)
WBC: 2.9 10*3/uL — ABNORMAL LOW (ref 4.0–10.5)
WBC: 5.3 10*3/uL (ref 4.0–10.5)
WBC: 3.8 10*3/uL — ABNORMAL LOW (ref 4.0–10.5)
WBC: 4.3 10*3/uL (ref 4.0–10.5)

## 2011-02-26 LAB — POCT I-STAT, CHEM 8
BUN: 36 mg/dL — ABNORMAL HIGH (ref 6–23)
Chloride: 99 mEq/L (ref 96–112)
Creatinine, Ser: 2.3 mg/dL — ABNORMAL HIGH (ref 0.4–1.2)
Glucose, Bld: 92 mg/dL (ref 70–99)
Potassium: 5.9 mEq/L — ABNORMAL HIGH (ref 3.5–5.1)

## 2011-02-26 LAB — CULTURE, BLOOD (ROUTINE X 2): Culture: NO GROWTH

## 2011-02-26 LAB — DIFFERENTIAL
Basophils Absolute: 0 10*3/uL (ref 0.0–0.1)
Eosinophils Absolute: 0 10*3/uL (ref 0.0–0.7)
Eosinophils Absolute: 0 10*3/uL (ref 0.0–0.7)
Eosinophils Relative: 1 % (ref 0–5)
Eosinophils Relative: 1 % (ref 0–5)
Eosinophils Relative: 1 % (ref 0–5)
Lymphocytes Relative: 27 % (ref 12–46)
Lymphocytes Relative: 27 % (ref 12–46)
Lymphs Abs: 1.4 10*3/uL (ref 0.7–4.0)
Lymphs Abs: 1.1 10*3/uL (ref 0.7–4.0)
Lymphs Abs: 1.1 10*3/uL (ref 0.7–4.0)
Monocytes Absolute: 0.6 10*3/uL (ref 0.1–1.0)
Monocytes Relative: 13 % — ABNORMAL HIGH (ref 3–12)
Neutro Abs: 2.6 10*3/uL (ref 1.7–7.7)
Neutrophils Relative %: 60 % (ref 43–77)

## 2011-02-26 LAB — FERRITIN: Ferritin: 262 ng/mL (ref 10–291)

## 2011-02-26 LAB — PROTIME-INR
INR: 1.99 — ABNORMAL HIGH (ref 0.00–1.49)
INR: 2.45 — ABNORMAL HIGH (ref 0.00–1.49)
INR: 3.28 — ABNORMAL HIGH (ref 0.00–1.49)
INR: 1.41 (ref 0.00–1.49)
INR: 2.17 — ABNORMAL HIGH (ref 0.00–1.49)
Prothrombin Time: 22.4 seconds — ABNORMAL HIGH (ref 11.6–15.2)
Prothrombin Time: 29.5 seconds — ABNORMAL HIGH (ref 11.6–15.2)
Prothrombin Time: 20.2 seconds — ABNORMAL HIGH (ref 11.6–15.2)

## 2011-02-26 LAB — CARDIOLIPIN ANTIBODIES, IGG, IGM, IGA
Anticardiolipin IgA: 6 APL U/mL — ABNORMAL LOW (ref <22)
Anticardiolipin IgG: 3 GPL U/mL — ABNORMAL LOW (ref <23)

## 2011-02-26 LAB — FOLATE RBC: RBC Folate: 839 ng/mL — ABNORMAL HIGH (ref 180–600)

## 2011-02-26 LAB — ANTITHROMBIN III: AntiThromb III Func: 77 % (ref 76–126)

## 2011-02-26 LAB — MAGNESIUM: Magnesium: 1.9 mg/dL (ref 1.5–2.5)

## 2011-02-26 LAB — BETA-2-GLYCOPROTEIN I ABS, IGG/M/A: Beta-2 Glyco I IgG: 0 G Units (ref <20)

## 2011-02-26 LAB — PROTEIN S, TOTAL: Protein S Ag, Total: 88 % (ref 70–140)

## 2011-02-26 LAB — FACTOR 5 LEIDEN

## 2011-02-26 LAB — LUPUS ANTICOAGULANT PANEL
PTT Lupus Anticoagulant: 80.4 secs — ABNORMAL HIGH (ref 30.0–45.6)
PTTLA 4:1 Mix: 57.8 secs — ABNORMAL HIGH (ref 30.0–45.6)
PTTLA Confirmation: 7.2 secs (ref <8.0)

## 2011-02-26 LAB — MRSA PCR SCREENING: MRSA by PCR: NEGATIVE

## 2011-02-26 LAB — HOMOCYSTEINE: Homocysteine: 23.1 umol/L — ABNORMAL HIGH (ref 4.0–15.4)

## 2011-02-26 LAB — POCT CARDIAC MARKERS
CKMB, poc: 1 ng/mL (ref 1.0–8.0)
Troponin i, poc: <0.05 ng/mL (ref 0.00–0.09)

## 2011-02-26 LAB — IRON AND TIBC
Iron: 62 ug/dL (ref 42–135)
Saturation Ratios: 18 % — ABNORMAL LOW (ref 20–55)
Saturation Ratios: 25 % (ref 20–55)
TIBC: 256 ug/dL (ref 250–470)
UIBC: 187 ug/dL

## 2011-02-26 LAB — AMMONIA: Ammonia: 31 umol/L (ref 11–35)

## 2011-02-26 LAB — RETICULOCYTES
Retic Count, Absolute: 88.9 10*3/uL (ref 19.0–186.0)
Retic Ct Pct: 2.2 % (ref 0.4–3.1)

## 2011-02-26 LAB — OSMOLALITY, URINE: Osmolality, Ur: 294 mOsm/kg — ABNORMAL LOW (ref 390–1090)

## 2011-02-26 LAB — VITAMIN B12: Vitamin B-12: 494 pg/mL (ref 211–911)

## 2011-02-26 LAB — PROTEIN C, TOTAL: Protein C, Total: 38 % — ABNORMAL LOW (ref 70–140)

## 2011-02-26 LAB — PROTEIN S ACTIVITY: Protein S Activity: 67 % — ABNORMAL LOW (ref 69–129)

## 2011-02-27 LAB — CBC
HCT: 29.8 % — ABNORMAL LOW (ref 36.0–46.0)
HCT: 34 % — ABNORMAL LOW (ref 36.0–46.0)
MCHC: 33.3 g/dL (ref 30.0–36.0)
MCV: 88.7 fL (ref 78.0–100.0)
MCV: 89.2 fL (ref 78.0–100.0)
Platelets: 224 10*3/uL (ref 150–400)
Platelets: 260 10*3/uL (ref 150–400)
RDW: 15.9 % — ABNORMAL HIGH (ref 11.5–15.5)
WBC: 3.9 10*3/uL — ABNORMAL LOW (ref 4.0–10.5)
WBC: 4.2 10*3/uL (ref 4.0–10.5)

## 2011-02-27 LAB — URINE MICROSCOPIC-ADD ON

## 2011-02-27 LAB — URINALYSIS, ROUTINE W REFLEX MICROSCOPIC
Glucose, UA: NEGATIVE mg/dL
Hgb urine dipstick: NEGATIVE
Specific Gravity, Urine: 1.016 (ref 1.005–1.030)
Urobilinogen, UA: 0.2 mg/dL (ref 0.0–1.0)
pH: 7 (ref 5.0–8.0)

## 2011-02-27 LAB — COMPREHENSIVE METABOLIC PANEL
Albumin: 3.3 g/dL — ABNORMAL LOW (ref 3.5–5.2)
BUN: 21 mg/dL (ref 6–23)
CO2: 33 mEq/L — ABNORMAL HIGH (ref 19–32)
Chloride: 99 mEq/L (ref 96–112)
Creatinine, Ser: 1.38 mg/dL — ABNORMAL HIGH (ref 0.4–1.2)
GFR calc non Af Amer: 36 mL/min — ABNORMAL LOW (ref >60)
Total Bilirubin: 0.9 mg/dL (ref 0.3–1.2)

## 2011-02-27 LAB — POCT CARDIAC MARKERS
CKMB, poc: 2.1 ng/mL (ref 1.0–8.0)
Myoglobin, poc: 233 ng/mL (ref 12–200)

## 2011-02-27 LAB — DIFFERENTIAL
Basophils Absolute: 0 10*3/uL (ref 0.0–0.1)
Lymphocytes Relative: 23 % (ref 12–46)
Neutro Abs: 2.5 10*3/uL (ref 1.7–7.7)

## 2011-02-27 LAB — CARDIAC PANEL(CRET KIN+CKTOT+MB+TROPI)
CK, MB: 3.2 ng/mL (ref 0.3–4.0)
Relative Index: INVALID (ref 0.0–2.5)
Total CK: 67 U/L (ref 7–177)

## 2011-02-27 LAB — PROTIME-INR: INR: 1.11 (ref 0.00–1.49)

## 2011-02-27 LAB — D-DIMER, QUANTITATIVE: D-Dimer, Quant: 2.43 ug/mL-FEU — ABNORMAL HIGH (ref 0.00–0.48)

## 2011-02-27 LAB — APTT: aPTT: 40 seconds — ABNORMAL HIGH (ref 24–37)

## 2011-03-06 LAB — CBC
HCT: 32.5 % — ABNORMAL LOW (ref 36.0–46.0)
HCT: 40.5 % (ref 36.0–46.0)
Hemoglobin: 11.1 g/dL — ABNORMAL LOW (ref 12.0–15.0)
Hemoglobin: 12.1 g/dL (ref 12.0–15.0)
MCHC: 33.1 g/dL (ref 30.0–36.0)
MCHC: 33.2 g/dL (ref 30.0–36.0)
MCV: 86.9 fL (ref 78.0–100.0)
MCV: 87.6 fL (ref 78.0–100.0)
MCV: 87.7 fL (ref 78.0–100.0)
Platelets: 173 10*3/uL (ref 150–400)
Platelets: 193 10*3/uL (ref 150–400)
Platelets: 199 10*3/uL (ref 150–400)
Platelets: 210 10*3/uL (ref 150–400)
Platelets: 214 10*3/uL (ref 150–400)
RBC: 3.56 MIL/uL — ABNORMAL LOW (ref 3.87–5.11)
RBC: 3.66 MIL/uL — ABNORMAL LOW (ref 3.87–5.11)
RDW: 13.9 % (ref 11.5–15.5)
RDW: 14.4 % (ref 11.5–15.5)
RDW: 14.5 % (ref 11.5–15.5)
WBC: 2.4 10*3/uL — ABNORMAL LOW (ref 4.0–10.5)
WBC: 2.5 10*3/uL — ABNORMAL LOW (ref 4.0–10.5)

## 2011-03-06 LAB — URINALYSIS, ROUTINE W REFLEX MICROSCOPIC
Hgb urine dipstick: NEGATIVE
Nitrite: NEGATIVE
Protein, ur: NEGATIVE mg/dL
Urobilinogen, UA: 0.2 mg/dL (ref 0.0–1.0)

## 2011-03-06 LAB — DIFFERENTIAL
Basophils Absolute: 0 10*3/uL (ref 0.0–0.1)
Eosinophils Relative: 2 % (ref 0–5)
Lymphocytes Relative: 24 % (ref 12–46)
Monocytes Absolute: 0.4 10*3/uL (ref 0.1–1.0)
Monocytes Relative: 10 % (ref 3–12)

## 2011-03-06 LAB — HEPARIN LEVEL (UNFRACTIONATED)
Heparin Unfractionated: 0.3 IU/mL (ref 0.30–0.70)
Heparin Unfractionated: 0.49 IU/mL (ref 0.30–0.70)
Heparin Unfractionated: 0.64 IU/mL (ref 0.30–0.70)

## 2011-03-06 LAB — BASIC METABOLIC PANEL
BUN: 11 mg/dL (ref 6–23)
BUN: 12 mg/dL (ref 6–23)
BUN: 13 mg/dL (ref 6–23)
BUN: 15 mg/dL (ref 6–23)
CO2: 24 mEq/L (ref 19–32)
CO2: 27 mEq/L (ref 19–32)
Calcium: 8.8 mg/dL (ref 8.4–10.5)
Calcium: 9.1 mg/dL (ref 8.4–10.5)
Calcium: 9.3 mg/dL (ref 8.4–10.5)
Chloride: 103 mEq/L (ref 96–112)
Chloride: 104 mEq/L (ref 96–112)
Creatinine, Ser: 1.39 mg/dL — ABNORMAL HIGH (ref 0.4–1.2)
Creatinine, Ser: 1.51 mg/dL — ABNORMAL HIGH (ref 0.4–1.2)
Creatinine, Ser: 1.52 mg/dL — ABNORMAL HIGH (ref 0.4–1.2)
GFR calc Af Amer: 39 mL/min — ABNORMAL LOW (ref >60)
GFR calc Af Amer: 44 mL/min — ABNORMAL LOW (ref >60)
GFR calc non Af Amer: 33 mL/min — ABNORMAL LOW (ref >60)
GFR calc non Af Amer: 35 mL/min — ABNORMAL LOW (ref >60)
GFR calc non Af Amer: 36 mL/min — ABNORMAL LOW (ref >60)
GFR calc non Af Amer: 40 mL/min — ABNORMAL LOW (ref >60)
Glucose, Bld: 84 mg/dL (ref 70–99)
Glucose, Bld: 95 mg/dL (ref 70–99)
Potassium: 4 mEq/L (ref 3.5–5.1)

## 2011-03-06 LAB — COMPREHENSIVE METABOLIC PANEL
AST: 38 U/L — ABNORMAL HIGH (ref 0–37)
CO2: 29 mEq/L (ref 19–32)
Calcium: 9.3 mg/dL (ref 8.4–10.5)
Creatinine, Ser: 1.6 mg/dL — ABNORMAL HIGH (ref 0.4–1.2)
GFR calc Af Amer: 37 mL/min — ABNORMAL LOW (ref >60)
GFR calc non Af Amer: 31 mL/min — ABNORMAL LOW (ref >60)
Total Protein: 7 g/dL (ref 6.0–8.3)

## 2011-03-06 LAB — POCT I-STAT 3, VENOUS BLOOD GAS (G3P V)
Bicarbonate: 23.6 mEq/L (ref 20.0–24.0)
TCO2: 25 mmol/L (ref 0–100)
pCO2, Ven: 43.8 mmHg — ABNORMAL LOW (ref 45.0–50.0)
pH, Ven: 7.339 — ABNORMAL HIGH (ref 7.250–7.300)
pO2, Ven: 37 mmHg (ref 30.0–45.0)

## 2011-03-06 LAB — TROPONIN I
Troponin I: 0.08 ng/mL — ABNORMAL HIGH (ref 0.00–0.06)
Troponin I: 0.09 ng/mL — ABNORMAL HIGH (ref 0.00–0.06)

## 2011-03-06 LAB — URINE CULTURE
Colony Count: NO GROWTH
Culture: NO GROWTH

## 2011-03-06 LAB — CK TOTAL AND CKMB (NOT AT ARMC)
CK, MB: 6.5 ng/mL (ref 0.3–4.0)
Relative Index: 4.6 — ABNORMAL HIGH (ref 0.0–2.5)
Relative Index: 5.2 — ABNORMAL HIGH (ref 0.0–2.5)
Relative Index: 6.3 — ABNORMAL HIGH (ref 0.0–2.5)
Total CK: 126 U/L (ref 7–177)
Total CK: 181 U/L — ABNORMAL HIGH (ref 7–177)

## 2011-03-06 LAB — POCT I-STAT 3, ART BLOOD GAS (G3+)
Acid-base deficit: 2 mmol/L (ref 0.0–2.0)
Bicarbonate: 23.7 mEq/L (ref 20.0–24.0)
O2 Saturation: 91 %
TCO2: 25 mmol/L (ref 0–100)
pCO2 arterial: 43.5 mmHg (ref 35.0–45.0)
pO2, Arterial: 64 mmHg — ABNORMAL LOW (ref 80.0–100.0)

## 2011-03-06 LAB — POCT I-STAT, CHEM 8
BUN: 16 mg/dL (ref 6–23)
Calcium, Ion: 1.12 mmol/L (ref 1.12–1.32)
Chloride: 103 mEq/L (ref 96–112)
Glucose, Bld: 96 mg/dL (ref 70–99)
TCO2: 26 mmol/L (ref 0–100)

## 2011-03-06 LAB — APTT: aPTT: 26 seconds (ref 24–37)

## 2011-03-06 LAB — TSH: TSH: 1.143 u[IU]/mL (ref 0.350–4.500)

## 2011-03-06 LAB — POCT CARDIAC MARKERS: Troponin i, poc: <0.05 ng/mL (ref 0.00–0.09)

## 2011-03-06 LAB — BRAIN NATRIURETIC PEPTIDE: Pro B Natriuretic peptide (BNP): 85 pg/mL (ref 0.0–100.0)

## 2011-03-07 ENCOUNTER — Emergency Department (HOSPITAL_COMMUNITY)
Admission: EM | Admit: 2011-03-07 | Discharge: 2011-03-07 | Disposition: A | Discharge status: Home / Self Care | Payer: Medicare Other | Attending: Emergency Medicine | Admitting: Emergency Medicine

## 2011-03-07 DIAGNOSIS — N751 Abscess of Bartholin's gland: Secondary | ICD-10-CM | POA: Insufficient documentation

## 2011-03-07 DIAGNOSIS — I10 Essential (primary) hypertension: Secondary | ICD-10-CM | POA: Insufficient documentation

## 2011-03-07 DIAGNOSIS — I2510 Atherosclerotic heart disease of native coronary artery without angina pectoris: Secondary | ICD-10-CM | POA: Insufficient documentation

## 2011-04-19 ENCOUNTER — Emergency Department (HOSPITAL_COMMUNITY)
Admission: EM | Admit: 2011-04-19 | Discharge: 2011-04-19 | Disposition: A | Discharge status: Home / Self Care | Payer: Medicaid Other | Attending: Emergency Medicine | Admitting: Emergency Medicine

## 2011-04-19 DIAGNOSIS — I2510 Atherosclerotic heart disease of native coronary artery without angina pectoris: Secondary | ICD-10-CM | POA: Insufficient documentation

## 2011-04-19 DIAGNOSIS — I10 Essential (primary) hypertension: Secondary | ICD-10-CM | POA: Insufficient documentation

## 2011-04-19 DIAGNOSIS — I509 Heart failure, unspecified: Secondary | ICD-10-CM | POA: Insufficient documentation

## 2011-04-19 DIAGNOSIS — F341 Dysthymic disorder: Secondary | ICD-10-CM | POA: Insufficient documentation

## 2011-04-19 DIAGNOSIS — H9209 Otalgia, unspecified ear: Secondary | ICD-10-CM | POA: Insufficient documentation

## 2011-04-19 DIAGNOSIS — Z66 Do not resuscitate: Secondary | ICD-10-CM | POA: Insufficient documentation

## 2011-04-19 DIAGNOSIS — F068 Other specified mental disorders due to known physiological condition: Secondary | ICD-10-CM | POA: Insufficient documentation

## 2011-04-19 DIAGNOSIS — Z86711 Personal history of pulmonary embolism: Secondary | ICD-10-CM | POA: Insufficient documentation

## 2018-09-19 ENCOUNTER — Encounter: Payer: Self-pay | Admitting: Emergency Medicine

## 2018-09-19 ENCOUNTER — Emergency Department (HOSPITAL_COMMUNITY)
Admission: EM | Admit: 2018-09-19 | Discharge: 2018-09-19 | Disposition: A | Discharge status: Home / Self Care | Payer: Medicare Other | Attending: Emergency Medicine | Admitting: Emergency Medicine

## 2018-09-19 DIAGNOSIS — I2510 Atherosclerotic heart disease of native coronary artery without angina pectoris: Secondary | ICD-10-CM | POA: Diagnosis not present

## 2018-09-19 DIAGNOSIS — D649 Anemia, unspecified: Secondary | ICD-10-CM | POA: Diagnosis not present

## 2018-09-19 DIAGNOSIS — R0602 Shortness of breath: Secondary | ICD-10-CM | POA: Diagnosis not present

## 2018-09-19 DIAGNOSIS — I10 Essential (primary) hypertension: Secondary | ICD-10-CM | POA: Diagnosis not present

## 2018-09-19 LAB — CBC WITH DIFFERENTIAL/PLATELET
ABS IMMATURE GRANULOCYTES: 0.01 10*3/uL (ref 0.00–0.07)
BASOS PCT: 1 %
Basophils Absolute: 0 10*3/uL (ref 0.0–0.1)
EOS PCT: 0 %
Eosinophils Absolute: 0 10*3/uL (ref 0.0–0.5)
HCT: 26.2 % — ABNORMAL LOW (ref 36.0–46.0)
Hemoglobin: 7.6 g/dL — ABNORMAL LOW (ref 12.0–15.0)
Immature Granulocytes: 0 %
Lymphocytes Relative: 28 %
Lymphs Abs: 0.9 10*3/uL (ref 0.7–4.0)
MCH: 28.3 pg (ref 26.0–34.0)
MCHC: 29 g/dL — ABNORMAL LOW (ref 30.0–36.0)
MCV: 97.4 fL (ref 80.0–100.0)
MONO ABS: 0.3 10*3/uL (ref 0.1–1.0)
Monocytes Relative: 8 %
NEUTROS ABS: 2.1 10*3/uL (ref 1.7–7.7)
NRBC: 0 % (ref 0.0–0.2)
Neutrophils Relative %: 63 %
Platelets: 169 10*3/uL (ref 150–400)
RBC: 2.69 MIL/uL — AB (ref 3.87–5.11)
RDW: 16.2 % — ABNORMAL HIGH (ref 11.5–15.5)
WBC: 3.3 10*3/uL — AB (ref 4.0–10.5)

## 2018-09-19 LAB — COMPREHENSIVE METABOLIC PANEL
ALT: 8 U/L (ref 0–44)
ANION GAP: 10 (ref 5–15)
AST: 12 U/L — ABNORMAL LOW (ref 15–41)
Albumin: 3.6 g/dL (ref 3.5–5.0)
Alkaline Phosphatase: 82 U/L (ref 38–126)
BUN: 26 mg/dL — ABNORMAL HIGH (ref 8–23)
CO2: 24 mmol/L (ref 22–32)
Calcium: 8.8 mg/dL — ABNORMAL LOW (ref 8.9–10.3)
Chloride: 107 mmol/L (ref 98–111)
Creatinine, Ser: 2.22 mg/dL — ABNORMAL HIGH (ref 0.44–1.00)
GFR calc non Af Amer: 18 mL/min — ABNORMAL LOW (ref >60)
GFR, EST AFRICAN AMERICAN: 21 mL/min — AB (ref >60)
Glucose, Bld: 130 mg/dL — ABNORMAL HIGH (ref 70–99)
POTASSIUM: 5.4 mmol/L — AB (ref 3.5–5.1)
SODIUM: 141 mmol/L (ref 135–145)
Total Bilirubin: 0.2 mg/dL — ABNORMAL LOW (ref 0.3–1.2)
Total Protein: 7 g/dL (ref 6.5–8.1)

## 2018-09-19 LAB — POC OCCULT BLOOD, ED: Fecal Occult Bld: NEGATIVE

## 2018-09-19 LAB — SAMPLE TO BLOOD BANK

## 2018-09-19 NOTE — ED Triage Notes (Signed)
Per EMS: Pt is from [facility], per pcp hemoglobin is low at 6.9.  No other c/o.

## 2018-09-19 NOTE — ED Provider Notes (Signed)
[facility] DEPT Provider Note   CSN: [id number] Arrival date & time: 09/19/18  1327     History   Chief Complaint Chief Complaint  Patient presents with  . Abnormal Lab    HPI Laura Houston is a 84 y.o. female.  HPI Patient sent in for anemia.  Hemoglobin on outside labs of 6.9.  Patient states her blood pressure is always low but does not know a baseline hemoglobin.  Denies black stool or blood in stools.  States she does feel little cold.  No fevers or chills.  No cough.  Does have some shortness of breath.  Patient states she is hungry and would like to eat some food. History reviewed. No pertinent past medical history.  Patient Active Problem List   Diagnosis Date Noted  . HYPERTENSION, MILD 08/26/2010  . BRADYCARDIA 08/26/2010  . EDEMA 08/26/2010  . CAD 08/24/2010    History reviewed. No pertinent surgical history.   OB History   None      Home Medications    Prior to Admission medications   Not on File    Family History History reviewed. No pertinent family history.  Social History Social History   Tobacco Use  . Smoking status: Never Smoker  . Smokeless tobacco: Never Used  Substance Use Topics  . Alcohol use: Never    Frequency: Never  . Drug use: Never     Allergies   Penicillins and Sulfonamide derivatives   Review of Systems Review of Systems  Constitutional: Negative for appetite change.  HENT: Negative for congestion.   Respiratory: Positive for shortness of breath.   Cardiovascular: Negative for chest pain.  Gastrointestinal: Negative for abdominal pain and blood in stool.  Genitourinary: Negative for flank pain.  Musculoskeletal: Negative for back pain.  Skin: Negative for pallor.  Psychiatric/Behavioral: Negative for confusion.     Physical Exam Updated Vital Signs BP 112/68 (BP Location: Left Arm)   Temp 98.5 F (36.9 C) (Oral)   Resp 16   Ht 5\' 5"  (1.651 m)   Wt 71.7 kg   SpO2  100%   BMI 26.29 kg/m   Physical Exam  Constitutional: She appears well-developed.  HENT:  Head: Atraumatic.  Eyes: EOM are normal.  Neck: Neck supple.  Cardiovascular: Normal rate.  Pulmonary/Chest: No respiratory distress. She has no wheezes.  Abdominal: There is no tenderness.  Musculoskeletal: She exhibits no edema.  Neurological: She is alert.  Skin: There is pallor.  Psychiatric: She has a normal mood and affect.     ED Treatments / Results  Labs (all labs ordered are listed, but only abnormal results are displayed) Labs Reviewed  CBC WITH DIFFERENTIAL/PLATELET  COMPREHENSIVE METABOLIC PANEL  POC OCCULT BLOOD, ED  SAMPLE TO BLOOD BANK    EKG None  Radiology No results found.  Procedures Procedures (including critical care time)  Medications Ordered in ED Medications - No data to display   Initial Impression / Assessment and Plan / ED Course  I have reviewed the triage vital signs and the nursing notes.  Pertinent labs & imaging results that were available during my care of the patient were reviewed by me and considered in my medical decision making (see chart for details).    Sent in from nursing home with hemoglobin of 6.  No old records sent.  Few years ago hemoglobin was anemic but did not appear to be this low.  Labs pending.  Care turned over to Dr. Ralene Bathe.  Final  Clinical Impressions(s) / ED Diagnoses   Final diagnoses:  Anemia, unspecified type    ED Discharge Orders    None       Davonna Belling, MD 09/19/18 1515

## 2018-09-19 NOTE — ED Notes (Signed)
Bed: XI35 Expected date:  Expected time:  Means of arrival:  Comments: 32F abnormal lab-low Hgb

## 2018-09-19 NOTE — ED Notes (Addendum)
Pt's daughter came out yelling at this RN that the room was too cold for her mom, stating no one has seen her daughter.  Earlier pt and her mother where informed we called EVS to allow [place] to turn up the temperature in the room. MD came during this time to see pt.

## 2018-09-19 NOTE — ED Notes (Signed)
ED Provider at bedside. 

## 2018-09-19 NOTE — ED Provider Notes (Signed)
Patient care assumed at 1600. She is a resident of [facility] and was referred to the emergency department for a hemoglobin of 6.9 obtained on lab draw on October 8. She is asymptomatic at this point. Hemoglobin today of 7.6, no evidence of rectal bleeding. Creatinine is slightly increased from two days prior at 2.2 today. Discussed with patient's primary care provider, Dr. Deforest Hoyles. Plan to return to nursing facility with outpatient recheck in the next few days.   Laura Reichert, MD 09/19/18 [phone]

## 2018-09-19 NOTE — Discharge Instructions (Addendum)
Laura Houston had a hemoglobin of 7.6 today on lab draw. Her creatinine was 2.2 and a potassium of 5.4. Make sure she is drinking plenty of fluids. Please have her follow up with Dr. Deforest Hoyles for recheck. She did not receive a blood transfusion today. Have her rechecked if she has any new or concerning symptoms.

## 2018-10-28 ENCOUNTER — Inpatient Hospital Stay (HOSPITAL_COMMUNITY)
Admission: EM | Admit: 2018-10-28 | Discharge: 2018-11-05 | DRG: 469 | Disposition: A | Discharge status: Skilled Nursing Facility | Payer: Medicare Other | Attending: Family Medicine | Admitting: Family Medicine

## 2018-10-28 ENCOUNTER — Emergency Department (HOSPITAL_COMMUNITY): Payer: Medicare Other

## 2018-10-28 ENCOUNTER — Other Ambulatory Visit: Payer: Self-pay

## 2018-10-28 DIAGNOSIS — N189 Chronic kidney disease, unspecified: Secondary | ICD-10-CM

## 2018-10-28 DIAGNOSIS — N179 Acute kidney failure, unspecified: Secondary | ICD-10-CM | POA: Diagnosis present

## 2018-10-28 DIAGNOSIS — D62 Acute posthemorrhagic anemia: Secondary | ICD-10-CM | POA: Diagnosis present

## 2018-10-28 DIAGNOSIS — Z515 Encounter for palliative care: Secondary | ICD-10-CM

## 2018-10-28 DIAGNOSIS — R001 Bradycardia, unspecified: Secondary | ICD-10-CM

## 2018-10-28 DIAGNOSIS — M1A9XX0 Chronic gout, unspecified, without tophus (tophi): Secondary | ICD-10-CM | POA: Diagnosis present

## 2018-10-28 DIAGNOSIS — T148XXA Other injury of unspecified body region, initial encounter: Secondary | ICD-10-CM

## 2018-10-28 DIAGNOSIS — D509 Iron deficiency anemia, unspecified: Secondary | ICD-10-CM

## 2018-10-28 DIAGNOSIS — Z66 Do not resuscitate: Secondary | ICD-10-CM | POA: Diagnosis present

## 2018-10-28 DIAGNOSIS — E875 Hyperkalemia: Secondary | ICD-10-CM | POA: Diagnosis present

## 2018-10-28 DIAGNOSIS — I5022 Chronic systolic (congestive) heart failure: Secondary | ICD-10-CM

## 2018-10-28 DIAGNOSIS — C189 Malignant neoplasm of colon, unspecified: Secondary | ICD-10-CM | POA: Diagnosis present

## 2018-10-28 DIAGNOSIS — E43 Unspecified severe protein-calorie malnutrition: Secondary | ICD-10-CM

## 2018-10-28 DIAGNOSIS — S72002A Fracture of unspecified part of neck of left femur, initial encounter for closed fracture: Secondary | ICD-10-CM | POA: Diagnosis not present

## 2018-10-28 DIAGNOSIS — Z79891 Long term (current) use of opiate analgesic: Secondary | ICD-10-CM

## 2018-10-28 DIAGNOSIS — I10 Essential (primary) hypertension: Secondary | ICD-10-CM | POA: Diagnosis present

## 2018-10-28 DIAGNOSIS — Z950 Presence of cardiac pacemaker: Secondary | ICD-10-CM

## 2018-10-28 DIAGNOSIS — W050XXA Fall from non-moving wheelchair, initial encounter: Secondary | ICD-10-CM | POA: Diagnosis present

## 2018-10-28 DIAGNOSIS — F329 Major depressive disorder, single episode, unspecified: Secondary | ICD-10-CM | POA: Diagnosis present

## 2018-10-28 DIAGNOSIS — K219 Gastro-esophageal reflux disease without esophagitis: Secondary | ICD-10-CM

## 2018-10-28 DIAGNOSIS — I5033 Acute on chronic diastolic (congestive) heart failure: Secondary | ICD-10-CM | POA: Diagnosis present

## 2018-10-28 DIAGNOSIS — I2510 Atherosclerotic heart disease of native coronary artery without angina pectoris: Secondary | ICD-10-CM | POA: Diagnosis present

## 2018-10-28 DIAGNOSIS — M109 Gout, unspecified: Secondary | ICD-10-CM | POA: Diagnosis present

## 2018-10-28 DIAGNOSIS — N184 Chronic kidney disease, stage 4 (severe): Secondary | ICD-10-CM

## 2018-10-28 DIAGNOSIS — Z6826 Body mass index (BMI) 26.0-26.9, adult: Secondary | ICD-10-CM

## 2018-10-28 DIAGNOSIS — I130 Hypertensive heart and chronic kidney disease with heart failure and stage 1 through stage 4 chronic kidney disease, or unspecified chronic kidney disease: Secondary | ICD-10-CM | POA: Diagnosis present

## 2018-10-28 DIAGNOSIS — K5900 Constipation, unspecified: Secondary | ICD-10-CM | POA: Diagnosis present

## 2018-10-28 DIAGNOSIS — N390 Urinary tract infection, site not specified: Secondary | ICD-10-CM | POA: Diagnosis present

## 2018-10-28 DIAGNOSIS — W19XXXA Unspecified fall, initial encounter: Secondary | ICD-10-CM | POA: Diagnosis present

## 2018-10-28 DIAGNOSIS — R0600 Dyspnea, unspecified: Secondary | ICD-10-CM

## 2018-10-28 DIAGNOSIS — Z79899 Other long term (current) drug therapy: Secondary | ICD-10-CM

## 2018-10-28 HISTORY — DX: Malignant neoplasm of colon, unspecified: C18.9

## 2018-10-28 HISTORY — DX: Iron deficiency anemia, unspecified: D50.9

## 2018-10-28 HISTORY — DX: Gout, unspecified: M10.9

## 2018-10-28 HISTORY — DX: Atherosclerotic heart disease of native coronary artery without angina pectoris: I25.10

## 2018-10-28 HISTORY — DX: Chronic kidney disease, stage 4 (severe): N18.4

## 2018-10-28 HISTORY — DX: Encounter for palliative care: Z51.5

## 2018-10-28 HISTORY — DX: Bradycardia, unspecified: R00.1

## 2018-10-28 HISTORY — DX: Gastro-esophageal reflux disease without esophagitis: K21.9

## 2018-10-28 HISTORY — DX: Presence of cardiac pacemaker: Z95.0

## 2018-10-28 HISTORY — DX: Chronic systolic (congestive) heart failure: I50.22

## 2018-10-28 LAB — CBC WITH DIFFERENTIAL/PLATELET
ABS IMMATURE GRANULOCYTES: 0.02 10*3/uL (ref 0.00–0.07)
BASOS ABS: 0 10*3/uL (ref 0.0–0.1)
BASOS PCT: 0 %
Eosinophils Absolute: 0 10*3/uL (ref 0.0–0.5)
Eosinophils Relative: 0 %
HCT: 28.1 % — ABNORMAL LOW (ref 36.0–46.0)
Hemoglobin: 8.3 g/dL — ABNORMAL LOW (ref 12.0–15.0)
IMMATURE GRANULOCYTES: 1 %
LYMPHS PCT: 16 %
Lymphs Abs: 0.7 10*3/uL (ref 0.7–4.0)
MCH: 27.7 pg (ref 26.0–34.0)
MCHC: 29.5 g/dL — ABNORMAL LOW (ref 30.0–36.0)
MCV: 93.7 fL (ref 80.0–100.0)
MONO ABS: 0.2 10*3/uL (ref 0.1–1.0)
Monocytes Relative: 6 %
NEUTROS ABS: 3.3 10*3/uL (ref 1.7–7.7)
NEUTROS PCT: 77 %
NRBC: 0 % (ref 0.0–0.2)
PLATELETS: 178 10*3/uL (ref 150–400)
RBC: 3 MIL/uL — AB (ref 3.87–5.11)
RDW: 15.6 % — ABNORMAL HIGH (ref 11.5–15.5)
WBC: 4.2 10*3/uL (ref 4.0–10.5)

## 2018-10-28 LAB — BASIC METABOLIC PANEL
ANION GAP: 9 (ref 5–15)
BUN: 38 mg/dL — ABNORMAL HIGH (ref 8–23)
CHLORIDE: 109 mmol/L (ref 98–111)
CO2: 21 mmol/L — ABNORMAL LOW (ref 22–32)
Calcium: 9.2 mg/dL (ref 8.9–10.3)
Creatinine, Ser: 2.31 mg/dL — ABNORMAL HIGH (ref 0.44–1.00)
GFR, EST AFRICAN AMERICAN: 20 mL/min — AB (ref >60)
GFR, EST NON AFRICAN AMERICAN: 17 mL/min — AB (ref >60)
Glucose, Bld: 115 mg/dL — ABNORMAL HIGH (ref 70–99)
POTASSIUM: 5.2 mmol/L — AB (ref 3.5–5.1)
Sodium: 139 mmol/L (ref 135–145)

## 2018-10-28 LAB — PROTIME-INR
INR: 1.01
PROTHROMBIN TIME: 13.2 s (ref 11.4–15.2)

## 2018-10-28 MED ORDER — FENTANYL CITRATE (PF) 100 MCG/2ML IJ SOLN
50.0000 ug | Freq: Once | INTRAMUSCULAR | Status: AC
  Administered 2018-10-28: 50 ug via INTRAVENOUS
  Filled 2018-10-28: qty 2

## 2018-10-28 MED ORDER — FENTANYL CITRATE (PF) 100 MCG/2ML IJ SOLN
50.0000 ug | Freq: Once | INTRAMUSCULAR | Status: AC
  Administered 2018-10-29: 50 ug via INTRAVENOUS
  Filled 2018-10-28: qty 2

## 2018-10-28 MED ORDER — TRAZODONE HCL 100 MG PO TABS
100.0000 mg | ORAL_TABLET | Freq: Every day | ORAL | Status: DC
  Administered 2018-10-29 – 2018-11-04 (×8): 100 mg via ORAL
  Filled 2018-10-28 (×6): qty 1
  Filled 2018-10-28: qty 2
  Filled 2018-10-28: qty 1

## 2018-10-28 NOTE — ED Notes (Signed)
Pt placed on a purewick 

## 2018-10-28 NOTE — ED Notes (Signed)
Family at bedside. 

## 2018-10-28 NOTE — Progress Notes (Signed)
Patient with left hip fracture, acute on chronic anemia, hyperkalemia, and acute on chronic renal impairment.   Will need medical optimization, will likely need blood at some point, plan for surgery Tuesday if optimized.  Will need hemiarthroplasty.    Full consult to follow.   Johnny Bridge, MD

## 2018-10-28 NOTE — ED Notes (Signed)
Patient transported to X-ray 

## 2018-10-28 NOTE — ED Triage Notes (Signed)
Pt BIB [facility] from [facility], fall from wheelchair at 1600 today. X-rays from facility show left femoral head fx. No shortening/rotation noted. A&O x4, BP 168/88

## 2018-10-28 NOTE — ED Notes (Signed)
Pt reports she was playing bingo tonight when she slipped on her shoe when trying to get up off the chair.  She denies feeling dizzy or hitting her head.  She is A&Ox 4.  In NAD.  L leg external rotation and shortening noted.

## 2018-10-29 ENCOUNTER — Other Ambulatory Visit: Payer: Self-pay

## 2018-10-29 ENCOUNTER — Inpatient Hospital Stay (HOSPITAL_COMMUNITY): Payer: Medicare Other | Admitting: Certified Registered Nurse Anesthetist

## 2018-10-29 ENCOUNTER — Encounter (HOSPITAL_COMMUNITY)
Admission: EM | Disposition: A | Discharge status: Skilled Nursing Facility | Payer: Self-pay | Source: Home / Self Care | Attending: Internal Medicine

## 2018-10-29 ENCOUNTER — Inpatient Hospital Stay (HOSPITAL_COMMUNITY): Payer: Medicare Other

## 2018-10-29 ENCOUNTER — Encounter (HOSPITAL_COMMUNITY): Payer: Self-pay | Admitting: Internal Medicine

## 2018-10-29 DIAGNOSIS — R001 Bradycardia, unspecified: Secondary | ICD-10-CM | POA: Diagnosis present

## 2018-10-29 DIAGNOSIS — Z515 Encounter for palliative care: Secondary | ICD-10-CM | POA: Insufficient documentation

## 2018-10-29 DIAGNOSIS — I10 Essential (primary) hypertension: Secondary | ICD-10-CM | POA: Diagnosis not present

## 2018-10-29 DIAGNOSIS — I351 Nonrheumatic aortic (valve) insufficiency: Secondary | ICD-10-CM | POA: Diagnosis not present

## 2018-10-29 DIAGNOSIS — Z79891 Long term (current) use of opiate analgesic: Secondary | ICD-10-CM | POA: Diagnosis not present

## 2018-10-29 DIAGNOSIS — E43 Unspecified severe protein-calorie malnutrition: Secondary | ICD-10-CM

## 2018-10-29 DIAGNOSIS — N390 Urinary tract infection, site not specified: Secondary | ICD-10-CM | POA: Diagnosis present

## 2018-10-29 DIAGNOSIS — W050XXA Fall from non-moving wheelchair, initial encounter: Secondary | ICD-10-CM | POA: Diagnosis present

## 2018-10-29 DIAGNOSIS — M109 Gout, unspecified: Secondary | ICD-10-CM | POA: Diagnosis present

## 2018-10-29 DIAGNOSIS — N179 Acute kidney failure, unspecified: Secondary | ICD-10-CM | POA: Diagnosis present

## 2018-10-29 DIAGNOSIS — F329 Major depressive disorder, single episode, unspecified: Secondary | ICD-10-CM | POA: Diagnosis present

## 2018-10-29 DIAGNOSIS — C189 Malignant neoplasm of colon, unspecified: Secondary | ICD-10-CM | POA: Diagnosis present

## 2018-10-29 DIAGNOSIS — D509 Iron deficiency anemia, unspecified: Secondary | ICD-10-CM | POA: Diagnosis present

## 2018-10-29 DIAGNOSIS — D62 Acute posthemorrhagic anemia: Secondary | ICD-10-CM | POA: Diagnosis present

## 2018-10-29 DIAGNOSIS — Z6826 Body mass index (BMI) 26.0-26.9, adult: Secondary | ICD-10-CM | POA: Diagnosis not present

## 2018-10-29 DIAGNOSIS — K219 Gastro-esophageal reflux disease without esophagitis: Secondary | ICD-10-CM | POA: Diagnosis present

## 2018-10-29 DIAGNOSIS — N184 Chronic kidney disease, stage 4 (severe): Secondary | ICD-10-CM

## 2018-10-29 DIAGNOSIS — M1A9XX0 Chronic gout, unspecified, without tophus (tophi): Secondary | ICD-10-CM | POA: Diagnosis present

## 2018-10-29 DIAGNOSIS — I2510 Atherosclerotic heart disease of native coronary artery without angina pectoris: Secondary | ICD-10-CM | POA: Insufficient documentation

## 2018-10-29 DIAGNOSIS — K5900 Constipation, unspecified: Secondary | ICD-10-CM | POA: Diagnosis present

## 2018-10-29 DIAGNOSIS — S72002A Fracture of unspecified part of neck of left femur, initial encounter for closed fracture: Secondary | ICD-10-CM | POA: Diagnosis present

## 2018-10-29 DIAGNOSIS — W19XXXA Unspecified fall, initial encounter: Secondary | ICD-10-CM | POA: Diagnosis present

## 2018-10-29 DIAGNOSIS — Z950 Presence of cardiac pacemaker: Secondary | ICD-10-CM | POA: Diagnosis present

## 2018-10-29 DIAGNOSIS — Z79899 Other long term (current) drug therapy: Secondary | ICD-10-CM | POA: Diagnosis not present

## 2018-10-29 DIAGNOSIS — I5022 Chronic systolic (congestive) heart failure: Secondary | ICD-10-CM | POA: Diagnosis not present

## 2018-10-29 DIAGNOSIS — I130 Hypertensive heart and chronic kidney disease with heart failure and stage 1 through stage 4 chronic kidney disease, or unspecified chronic kidney disease: Secondary | ICD-10-CM | POA: Diagnosis present

## 2018-10-29 DIAGNOSIS — E875 Hyperkalemia: Secondary | ICD-10-CM

## 2018-10-29 DIAGNOSIS — I5033 Acute on chronic diastolic (congestive) heart failure: Secondary | ICD-10-CM | POA: Diagnosis present

## 2018-10-29 DIAGNOSIS — Z66 Do not resuscitate: Secondary | ICD-10-CM | POA: Diagnosis present

## 2018-10-29 HISTORY — PX: HIP ARTHROPLASTY: SHX981

## 2018-10-29 LAB — BRAIN NATRIURETIC PEPTIDE: B NATRIURETIC PEPTIDE 5: 580.4 pg/mL — AB (ref 0.0–100.0)

## 2018-10-29 LAB — PREPARE RBC (CROSSMATCH)

## 2018-10-29 LAB — APTT: aPTT: 34 seconds (ref 24–36)

## 2018-10-29 LAB — SURGICAL PCR SCREEN
MRSA, PCR: NEGATIVE
Staphylococcus aureus: NEGATIVE

## 2018-10-29 SURGERY — HEMIARTHROPLASTY, HIP, DIRECT ANTERIOR APPROACH, FOR FRACTURE
Anesthesia: Spinal | Site: Hip | Laterality: Left

## 2018-10-29 MED ORDER — SODIUM CHLORIDE 0.9 % IV SOLN
INTRAVENOUS | Status: DC | PRN
  Administered 2018-10-29: 20 ug/min via INTRAVENOUS

## 2018-10-29 MED ORDER — FENTANYL 50 MCG/HR TD PT72
75.0000 ug | MEDICATED_PATCH | TRANSDERMAL | Status: DC
  Administered 2018-10-29: 75 ug via TRANSDERMAL
  Filled 2018-10-29: qty 3

## 2018-10-29 MED ORDER — METHOCARBAMOL 500 MG PO TABS
500.0000 mg | ORAL_TABLET | Freq: Three times a day (TID) | ORAL | Status: DC | PRN
  Administered 2018-10-31 – 2018-11-04 (×3): 500 mg via ORAL
  Filled 2018-10-29 (×3): qty 1

## 2018-10-29 MED ORDER — PROPOFOL 500 MG/50ML IV EMUL
INTRAVENOUS | Status: DC | PRN
  Administered 2018-10-29: 50 ug/kg/min via INTRAVENOUS

## 2018-10-29 MED ORDER — ONDANSETRON HCL 4 MG/2ML IJ SOLN
4.0000 mg | Freq: Three times a day (TID) | INTRAMUSCULAR | Status: DC | PRN
  Administered 2018-10-29: 4 mg via INTRAVENOUS

## 2018-10-29 MED ORDER — ENSURE ENLIVE PO LIQD
237.0000 mL | Freq: Three times a day (TID) | ORAL | Status: DC
  Administered 2018-10-29 – 2018-11-04 (×17): 237 mL via ORAL

## 2018-10-29 MED ORDER — SODIUM POLYSTYRENE SULFONATE 15 GM/60ML PO SUSP
15.0000 g | Freq: Once | ORAL | Status: AC
  Administered 2018-10-29: 15 g via ORAL
  Filled 2018-10-29: qty 60

## 2018-10-29 MED ORDER — ESCITALOPRAM OXALATE 10 MG PO TABS
10.0000 mg | ORAL_TABLET | Freq: Every day | ORAL | Status: DC
  Administered 2018-10-30 – 2018-11-05 (×7): 10 mg via ORAL
  Filled 2018-10-29 (×7): qty 1

## 2018-10-29 MED ORDER — VANCOMYCIN HCL 1000 MG IV SOLR
INTRAVENOUS | Status: DC | PRN
  Administered 2018-10-29: 1000 mg via INTRAVENOUS

## 2018-10-29 MED ORDER — VANCOMYCIN HCL IN DEXTROSE 1-5 GM/200ML-% IV SOLN
INTRAVENOUS | Status: AC
  Filled 2018-10-29: qty 200

## 2018-10-29 MED ORDER — ROCURONIUM BROMIDE 50 MG/5ML IV SOSY
PREFILLED_SYRINGE | INTRAVENOUS | Status: AC
  Filled 2018-10-29: qty 5

## 2018-10-29 MED ORDER — SUCCINYLCHOLINE CHLORIDE 200 MG/10ML IV SOSY
PREFILLED_SYRINGE | INTRAVENOUS | Status: AC
  Filled 2018-10-29: qty 10

## 2018-10-29 MED ORDER — HYDRALAZINE HCL 20 MG/ML IJ SOLN: 5.0000 mg | INTRAMUSCULAR | Status: DC | PRN

## 2018-10-29 MED ORDER — TRANEXAMIC ACID-NACL 1000-0.7 MG/100ML-% IV SOLN
INTRAVENOUS | Status: AC | PRN
  Administered 2018-10-29: 2000 mg via INTRAVENOUS

## 2018-10-29 MED ORDER — VITAMIN D 25 MCG (1000 UNIT) PO TABS
1000.0000 [IU] | ORAL_TABLET | Freq: Every day | ORAL | Status: DC
  Administered 2018-10-30 – 2018-11-05 (×7): 1000 [IU] via ORAL
  Filled 2018-10-29 (×7): qty 1

## 2018-10-29 MED ORDER — SODIUM CHLORIDE (PF) 0.9 % IJ SOLN
INTRAMUSCULAR | Status: AC
  Filled 2018-10-29: qty 10

## 2018-10-29 MED ORDER — 0.9 % SODIUM CHLORIDE (POUR BTL) OPTIME
TOPICAL | Status: DC | PRN
  Administered 2018-10-29: 1000 mL

## 2018-10-29 MED ORDER — MUSCLE RUB 10-15 % EX CREA
1.0000 "application " | TOPICAL_CREAM | Freq: Two times a day (BID) | CUTANEOUS | Status: DC
  Administered 2018-10-31 – 2018-11-04 (×9): 1 via TOPICAL
  Filled 2018-10-29 (×3): qty 85

## 2018-10-29 MED ORDER — PROPOFOL 10 MG/ML IV BOLUS
INTRAVENOUS | Status: DC | PRN
  Administered 2018-10-29 (×4): 20 mg via INTRAVENOUS

## 2018-10-29 MED ORDER — ISOSORBIDE MONONITRATE ER 60 MG PO TB24
60.0000 mg | ORAL_TABLET | Freq: Every day | ORAL | Status: DC
  Administered 2018-10-30 – 2018-11-01 (×3): 60 mg via ORAL
  Filled 2018-10-29 (×3): qty 1

## 2018-10-29 MED ORDER — ZOLPIDEM TARTRATE 5 MG PO TABS: 5.0000 mg | ORAL_TABLET | Freq: Every evening | ORAL | Status: DC | PRN

## 2018-10-29 MED ORDER — EPHEDRINE 5 MG/ML INJ
INTRAVENOUS | Status: AC
  Filled 2018-10-29: qty 10

## 2018-10-29 MED ORDER — ACETAMINOPHEN 500 MG PO TABS
500.0000 mg | ORAL_TABLET | Freq: Four times a day (QID) | ORAL | Status: DC | PRN
  Administered 2018-11-01 – 2018-11-03 (×5): 500 mg via ORAL
  Filled 2018-10-29 (×6): qty 1

## 2018-10-29 MED ORDER — FERROUS SULFATE 325 (65 FE) MG PO TABS
325.0000 mg | ORAL_TABLET | Freq: Every day | ORAL | Status: DC
  Administered 2018-10-30 – 2018-11-05 (×7): 325 mg via ORAL
  Filled 2018-10-29 (×7): qty 1

## 2018-10-29 MED ORDER — CHLORHEXIDINE GLUCONATE 4 % EX LIQD
60.0000 mL | Freq: Once | CUTANEOUS | Status: DC
  Filled 2018-10-29: qty 60

## 2018-10-29 MED ORDER — POLYETHYLENE GLYCOL 3350 17 G PO PACK
17.0000 g | PACK | Freq: Every day | ORAL | Status: DC | PRN
  Administered 2018-11-01 – 2018-11-04 (×2): 17 g via ORAL
  Filled 2018-10-29: qty 1

## 2018-10-29 MED ORDER — ADULT MULTIVITAMIN W/MINERALS CH
1.0000 | ORAL_TABLET | Freq: Every day | ORAL | Status: DC
  Administered 2018-10-30 – 2018-11-05 (×7): 1 via ORAL
  Filled 2018-10-29 (×7): qty 1

## 2018-10-29 MED ORDER — OXYCODONE-ACETAMINOPHEN 5-325 MG PO TABS
1.0000 | ORAL_TABLET | ORAL | Status: DC | PRN
  Administered 2018-10-29 – 2018-11-04 (×6): 1 via ORAL
  Filled 2018-10-29 (×6): qty 1

## 2018-10-29 MED ORDER — AMLODIPINE BESYLATE 5 MG PO TABS
5.0000 mg | ORAL_TABLET | Freq: Every day | ORAL | Status: DC
  Administered 2018-10-30: 5 mg via ORAL
  Filled 2018-10-29: qty 1

## 2018-10-29 MED ORDER — ONDANSETRON HCL 4 MG/2ML IJ SOLN
INTRAMUSCULAR | Status: AC
  Filled 2018-10-29: qty 2

## 2018-10-29 MED ORDER — MELATONIN 3 MG PO TABS
6.0000 mg | ORAL_TABLET | Freq: Every day | ORAL | Status: DC
  Administered 2018-10-29 – 2018-11-04 (×7): 6 mg via ORAL
  Filled 2018-10-29 (×8): qty 2

## 2018-10-29 MED ORDER — PROPOFOL 10 MG/ML IV BOLUS
INTRAVENOUS | Status: AC
  Filled 2018-10-29: qty 20

## 2018-10-29 MED ORDER — POVIDONE-IODINE 10 % EX SWAB: 2.0000 "application " | Freq: Once | CUTANEOUS | Status: DC

## 2018-10-29 MED ORDER — MIDAZOLAM HCL 2 MG/2ML IJ SOLN
INTRAMUSCULAR | Status: AC
  Filled 2018-10-29: qty 2

## 2018-10-29 MED ORDER — ONDANSETRON HCL 4 MG/2ML IJ SOLN: 4.0000 mg | Freq: Once | INTRAMUSCULAR | Status: DC | PRN

## 2018-10-29 MED ORDER — CARVEDILOL 12.5 MG PO TABS
12.5000 mg | ORAL_TABLET | Freq: Two times a day (BID) | ORAL | Status: DC
  Administered 2018-10-29 – 2018-10-31 (×4): 12.5 mg via ORAL
  Filled 2018-10-29 (×7): qty 1

## 2018-10-29 MED ORDER — ALLOPURINOL 100 MG PO TABS
100.0000 mg | ORAL_TABLET | Freq: Every day | ORAL | Status: DC
  Administered 2018-10-30 – 2018-11-05 (×7): 100 mg via ORAL
  Filled 2018-10-29 (×7): qty 1

## 2018-10-29 MED ORDER — FUROSEMIDE 40 MG PO TABS
40.0000 mg | ORAL_TABLET | Freq: Every day | ORAL | Status: DC
  Administered 2018-10-29 – 2018-10-30 (×3): 40 mg via ORAL
  Filled 2018-10-29 (×3): qty 1

## 2018-10-29 MED ORDER — LIDOCAINE 2% (20 MG/ML) 5 ML SYRINGE
INTRAMUSCULAR | Status: AC
  Filled 2018-10-29: qty 5

## 2018-10-29 MED ORDER — PHENYLEPHRINE 40 MCG/ML (10ML) SYRINGE FOR IV PUSH (FOR BLOOD PRESSURE SUPPORT)
PREFILLED_SYRINGE | INTRAVENOUS | Status: AC
  Filled 2018-10-29: qty 10

## 2018-10-29 MED ORDER — FENTANYL CITRATE (PF) 250 MCG/5ML IJ SOLN
INTRAMUSCULAR | Status: AC
  Filled 2018-10-29: qty 5

## 2018-10-29 MED ORDER — ARTIFICIAL TEARS OPHTHALMIC OINT
TOPICAL_OINTMENT | OPHTHALMIC | Status: AC
  Filled 2018-10-29: qty 3.5

## 2018-10-29 MED ORDER — FENTANYL CITRATE (PF) 100 MCG/2ML IJ SOLN: 25.0000 ug | INTRAMUSCULAR | Status: DC | PRN

## 2018-10-29 MED ORDER — ASPIRIN 81 MG PO CHEW
81.0000 mg | CHEWABLE_TABLET | Freq: Every day | ORAL | Status: DC
  Administered 2018-10-30 – 2018-11-05 (×7): 81 mg via ORAL
  Filled 2018-10-29 (×7): qty 1

## 2018-10-29 MED ORDER — TRANEXAMIC ACID 1000 MG/10ML IV SOLN
2000.0000 mg | INTRAVENOUS | Status: AC
  Filled 2018-10-29: qty 20

## 2018-10-29 MED ORDER — SENNA 8.6 MG PO TABS
2.0000 | ORAL_TABLET | Freq: Every day | ORAL | Status: DC
  Administered 2018-10-30 – 2018-11-01 (×3): 17.2 mg via ORAL
  Administered 2018-11-02: 8.6 mg via ORAL
  Administered 2018-11-04 – 2018-11-05 (×2): 17.2 mg via ORAL
  Filled 2018-10-29 (×6): qty 2

## 2018-10-29 MED ORDER — NEOSTIGMINE METHYLSULFATE 3 MG/3ML IV SOSY
PREFILLED_SYRINGE | INTRAVENOUS | Status: AC
  Filled 2018-10-29: qty 3

## 2018-10-29 MED ORDER — PANTOPRAZOLE SODIUM 40 MG PO TBEC
40.0000 mg | DELAYED_RELEASE_TABLET | Freq: Every day | ORAL | Status: DC
  Administered 2018-10-30 – 2018-11-05 (×7): 40 mg via ORAL
  Filled 2018-10-29 (×6): qty 1

## 2018-10-29 MED ORDER — MORPHINE SULFATE (PF) 2 MG/ML IV SOLN: 1.0000 mg | INTRAVENOUS | Status: DC | PRN

## 2018-10-29 MED ORDER — CLINDAMYCIN PHOSPHATE 600 MG/50ML IV SOLN
600.0000 mg | INTRAVENOUS | Status: DC
  Filled 2018-10-29: qty 50

## 2018-10-29 MED ORDER — CEFAZOLIN SODIUM-DEXTROSE 2-4 GM/100ML-% IV SOLN: 2.0000 g | INTRAVENOUS | Status: DC

## 2018-10-29 MED ORDER — LACTATED RINGERS IV SOLN
INTRAVENOUS | Status: DC | PRN
  Administered 2018-10-29: 14:00:00 via INTRAVENOUS

## 2018-10-29 MED ORDER — CLINDAMYCIN PHOSPHATE 600 MG/50ML IV SOLN
600.0000 mg | Freq: Four times a day (QID) | INTRAVENOUS | Status: AC
  Administered 2018-10-29 – 2018-10-30 (×3): 600 mg via INTRAVENOUS
  Filled 2018-10-29 (×3): qty 50

## 2018-10-29 MED ORDER — GLYCOPYRROLATE PF 0.2 MG/ML IJ SOSY
PREFILLED_SYRINGE | INTRAMUSCULAR | Status: AC
  Filled 2018-10-29: qty 1

## 2018-10-29 SURGICAL SUPPLY — 57 items
BALL HIP DEPUY 50 (Hips) ×1 IMPLANT
BLADE SAW SAG 73X25 THK (BLADE) ×1
BLADE SAW SGTL 73X25 THK (BLADE) ×2 IMPLANT
BRUSH FEMORAL CANAL (MISCELLANEOUS) IMPLANT
BRUSH SCRUB SURG 4.25 DISP (MISCELLANEOUS) ×6 IMPLANT
COVER SURGICAL LIGHT HANDLE (MISCELLANEOUS) ×3 IMPLANT
COVER WAND RF STERILE (DRAPES) ×3 IMPLANT
DRAPE INCISE IOBAN 85X60 (DRAPES) ×3 IMPLANT
DRAPE ORTHO SPLIT 77X108 STRL (DRAPES) ×4
DRAPE SURG ORHT 6 SPLT 77X108 (DRAPES) ×2 IMPLANT
DRAPE U-SHAPE 47X51 STRL (DRAPES) ×3 IMPLANT
DRILL BIT 7/64X5 (BIT) ×3 IMPLANT
DRSG MEPILEX BORDER 4X8 (GAUZE/BANDAGES/DRESSINGS) ×3 IMPLANT
ELECT BLADE 6.5 EXT (BLADE) IMPLANT
ELECT CAUTERY BLADE 6.4 (BLADE) IMPLANT
ELECT REM PT RETURN 9FT ADLT (ELECTROSURGICAL) ×3
ELECTRODE REM PT RTRN 9FT ADLT (ELECTROSURGICAL) ×1 IMPLANT
GLOVE BIO SURGEON STRL SZ7.5 (GLOVE) ×3 IMPLANT
GLOVE BIO SURGEON STRL SZ8 (GLOVE) ×3 IMPLANT
GLOVE BIOGEL PI IND STRL 8 (GLOVE) ×2 IMPLANT
GLOVE BIOGEL PI INDICATOR 8 (GLOVE) ×4
GOWN STRL REUS W/ TWL LRG LVL3 (GOWN DISPOSABLE) ×2 IMPLANT
GOWN STRL REUS W/ TWL XL LVL3 (GOWN DISPOSABLE) ×1 IMPLANT
GOWN STRL REUS W/TWL 2XL LVL3 (GOWN DISPOSABLE) IMPLANT
GOWN STRL REUS W/TWL LRG LVL3 (GOWN DISPOSABLE) ×4
GOWN STRL REUS W/TWL XL LVL3 (GOWN DISPOSABLE) ×2
HANDPIECE INTERPULSE COAX TIP (DISPOSABLE)
HIP BALL DEPUY 50 (Hips) ×3 IMPLANT
KIT BASIN OR (CUSTOM PROCEDURE TRAY) ×3 IMPLANT
KIT TURNOVER KIT B (KITS) ×3 IMPLANT
MANIFOLD NEPTUNE II (INSTRUMENTS) ×3 IMPLANT
NEEDLE 1/2 CIR MAYO (NEEDLE) IMPLANT
NS IRRIG 1000ML POUR BTL (IV SOLUTION) ×3 IMPLANT
PACK TOTAL JOINT (CUSTOM PROCEDURE TRAY) ×3 IMPLANT
PAD ARMBOARD 7.5X6 YLW CONV (MISCELLANEOUS) ×6 IMPLANT
PILLOW ABDUCTION HIP (SOFTGOODS) ×3 IMPLANT
PRESSURIZER FEMORAL UNIV (MISCELLANEOUS) IMPLANT
RETRIEVER SUT HEWSON (MISCELLANEOUS) ×3 IMPLANT
SET HNDPC FAN SPRY TIP SCT (DISPOSABLE) IMPLANT
SPACER FEM TAPERED +0 12/14 (Hips) ×3 IMPLANT
STAPLER VISISTAT 35W (STAPLE) ×3 IMPLANT
STEM SUMMIT PRESSFIT HIP SZ7 (Hips) ×3 IMPLANT
SUT ETHILON 2 0 PSLX (SUTURE) ×6 IMPLANT
SUT FIBERWIRE #2 38 T-5 BLUE (SUTURE) ×6
SUT VIC AB 1 CT1 18XCR BRD 8 (SUTURE) ×1 IMPLANT
SUT VIC AB 1 CT1 27 (SUTURE) ×2
SUT VIC AB 1 CT1 27XBRD ANBCTR (SUTURE) ×1 IMPLANT
SUT VIC AB 1 CT1 8-18 (SUTURE) ×2
SUT VIC AB 2-0 CT1 27 (SUTURE) ×4
SUT VIC AB 2-0 CT1 TAPERPNT 27 (SUTURE) ×2 IMPLANT
SUTURE FIBERWR #2 38 T-5 BLUE (SUTURE) ×2 IMPLANT
TOWEL OR 17X24 6PK STRL BLUE (TOWEL DISPOSABLE) ×3 IMPLANT
TOWEL OR 17X26 10 PK STRL BLUE (TOWEL DISPOSABLE) ×3 IMPLANT
TOWER CARTRIDGE SMART MIX (DISPOSABLE) IMPLANT
TRAY FOLEY CATH 16FRSI W/METER (SET/KITS/TRAYS/PACK) ×3 IMPLANT
WATER STERILE IRR 1000ML POUR (IV SOLUTION) IMPLANT
YANKAUER SUCT BULB TIP NO VENT (SUCTIONS) ×3 IMPLANT

## 2018-10-29 NOTE — Progress Notes (Signed)
Cardiology called for pre-op clearance for hip surgery. High volume consult day. Patient is no longer on the floor, has gone to OR. IM note indicates improvement in edema this with IV Lasix.  Spoke with Dr. Cruzita Lederer who affirms consult no longer needed. He felt patient was in acceptable pre-op state. Cardiology is happy to consult going forward; please call if needed. Florabel Faulks PA-C

## 2018-10-29 NOTE — Progress Notes (Signed)
Pt returned to room 5N08 after surgery. Received report from [place], [place] in PACU. See reassessment. Will continue to monitor.

## 2018-10-29 NOTE — Plan of Care (Signed)
Problem: Self-Concept: Goal: Ability to maintain and perform role responsibilities to the fullest extent possible will improve Outcome: Progressing   Problem: Pain Management: Goal: Pain level will decrease Outcome: Progressing

## 2018-10-29 NOTE — Anesthesia Procedure Notes (Signed)
Spinal  Patient location during procedure: OR Start time: 10/29/2018 2:35 PM End time: 10/29/2018 2:45 PM Staffing Anesthesiologist: Roberts Gaudy, MD Performed: anesthesiologist  Preanesthetic Checklist Completed: patient identified, site marked, surgical consent, pre-op evaluation, timeout performed, IV checked, risks and benefits discussed and monitors and equipment checked Spinal Block Patient position: left lateral decubitus Prep: ChloraPrep Patient monitoring: heart rate, cardiac monitor, continuous pulse ox and blood pressure Approach: left paramedian Location: L3-4 Injection technique: single-shot Needle Needle type: Tuohy  Needle gauge: 22 G Additional Notes 10 mg 0.75% Bupivacaine injected easily

## 2018-10-29 NOTE — Anesthesia Preprocedure Evaluation (Addendum)
Anesthesia Evaluation  Patient identified by MRN, date of birth, ID band Patient awake    Reviewed: Allergy & Precautions, NPO status , Patient's Chart, lab work & pertinent test results  Airway Mallampati: II  TM Distance: >3 FB Neck ROM: Full    Dental   Pulmonary    breath sounds clear to auscultation       Cardiovascular  Rhythm:Regular Rate:Normal     Neuro/Psych    GI/Hepatic   Endo/Other    Renal/GU      Musculoskeletal   Abdominal   Peds  Hematology   Anesthesia Other Findings   Reproductive/Obstetrics                            Anesthesia Physical Anesthesia Plan  ASA: III  Anesthesia Plan: Spinal   Post-op Pain Management:    Induction:   PONV Risk Score and Plan: Ondansetron and Dexamethasone  Airway Management Planned: Natural Airway and Simple Face Mask  Additional Equipment:   Intra-op Plan:   Post-operative Plan:   Informed Consent: I have reviewed the patients History and Physical, chart, labs and discussed the procedure including the risks, benefits and alternatives for the proposed anesthesia with the patient or authorized representative who has indicated his/her understanding and acceptance.     Plan Discussed with: CRNA and Anesthesiologist  Anesthesia Plan Comments:         Anesthesia Quick Evaluation

## 2018-10-29 NOTE — H&P (Signed)
History and Physical    Laura Houston MWN:[id number] DOB: [birth year] DOA: 10/28/2018  Referring MD/NP/PA:   PCP: Patient, No Pcp Per   Patient coming from:  The patient is coming from SNF.  At baseline, pt is dependent for most of ADL.        Chief Complaint: Fall, and left hip pain  HPI: Laura Houston is a 84 y.o. female with medical history significant of hypertension, CAD, PAD, CHF, bradycardia, CKD 4, pacemaker placement, GERD, gout, depression, iron deficiency anemia, colon cancer (s/p of partial colectomy, no XTR or chemotherapy), who presents with fall and left hip pain.  Per her daughter, pt fell from wheelchair in SNF at about 1600 today. No LOC.  Patient strongly denies any head or neck injury.  She injured her left hip, causing severe pain.  No leg numbness. X-rays from facility show left femoral neck fx. patient denies any chest pain, shortness breath, cough, fever or chills.  No nausea, vomiting, diarrhea, abdominal pain, symptoms for UTI.  No unilateral weakness, numbness or tingling to extremities been no facial droop or slurred speech.  Denies any headache or neck pain. Pt has bilateral leg edema.  ED Course: pt was found to have WBC 4.1, INR 1.01, potassium 5.2, slightly worsening renal function, temperature 99.1, bradycardia, oxygen saturation 94% on room air.  Chest x-ray negative.  X-ray showed mildly displaced left femoral neck fracture in ED.  Patient is admitted to telemetry bed as inpatient.  Orthopedic surgeon, Dr. Mardelle Matte was consulted, likely to do surgery closely.  Review of Systems:   General: no fevers, chills, no body weight gain, has fatigue HEENT: no blurry vision, hearing changes or sore throat Respiratory: no dyspnea, coughing, wheezing CV: no chest pain, no palpitations GI: no nausea, vomiting, abdominal pain, diarrhea, constipation GU: no dysuria, burning on urination, increased urinary frequency, hematuria  Ext: has leg edema Neuro: no  unilateral weakness, numbness, or tingling, no vision change or hearing loss. Has fall. Skin: no rash, no skin tear. MSK: has left hip pain. Heme: No easy bruising.  Travel history: No recent long distant travel.  Allergy:  Allergies  Allergen Reactions  . Doxycycline Other (See Comments)    On MAR  . Penicillins Other (See Comments)    Has patient had a PCN reaction causing immediate rash, facial/tongue/throat swelling, SOB or lightheadedness with hypotension: Unknown Has patient had a PCN reaction causing severe rash involving mucus membranes or skin necrosis: Unknown Has patient had a PCN reaction that required hospitalization: Unknown Has patient had a PCN reaction occurring within the last 10 years: Unknown If all of the above answers are "NO", then may proceed with Cephalosporin use.   . Sulfonamide Derivatives     Past Medical History:  Diagnosis Date  . Bradycardia   . CAD (coronary artery disease)   . Chronic systolic (congestive) heart failure ([place])   . CKD (chronic kidney disease), stage IV ([place])   . Colon cancer [facility])    s/p of colectomy  . GERD (gastroesophageal reflux disease)   . Gout   . Hospice care   . Iron deficiency anemia   . Status post placement of cardiac pacemaker     Past Surgical History:  Procedure Laterality Date  . ABDOMINAL HYSTERECTOMY    . PARTIAL COLECTOMY      Social History:  reports that she has never smoked. She has never used smokeless tobacco. She reports that she does not drink alcohol or use drugs.  Family History:  Family History  Problem Relation Age of Onset  . AAA (abdominal aortic aneurysm) Mother      Prior to Admission medications   Medication Sig Start Date End Date Taking? Authorizing Provider  acetaminophen (TYLENOL) 500 MG tablet Take 500 mg by mouth every 6 (six) hours as needed for mild pain, moderate pain or headache.   Yes [provider]  acetaminophen (TYLENOL) 500 MG tablet Take 500 mg by mouth  at bedtime.   Yes [provider]  allopurinol (ZYLOPRIM) 100 MG tablet Take 100 mg by mouth daily.   Yes [provider]  amLODipine (NORVASC) 5 MG tablet Take 5 mg by mouth daily.   Yes [provider]  aspirin 81 MG chewable tablet Chew 81 mg by mouth daily.   Yes [provider]  carvedilol (COREG) 12.5 MG tablet Take 12.5 mg by mouth 2 (two) times daily with a meal.   Yes [provider]  cholecalciferol (VITAMIN D) 1000 units tablet Take 1,000 Units by mouth daily.   Yes [provider]  escitalopram (LEXAPRO) 10 MG tablet Take 10 mg by mouth daily.   Yes [provider]  fentaNYL (DURAGESIC - DOSED MCG/HR) 75 MCG/HR Place 75 mcg onto the skin every 3 (three) days.   Yes [provider]  ferrous sulfate 325 (65 FE) MG EC tablet Take 325 mg by mouth daily with breakfast.   Yes [provider]  furosemide (LASIX) 40 MG tablet Take 40 mg by mouth at bedtime.   Yes [provider]  hyoscyamine (LEVSIN, ANASPAZ) 0.125 MG tablet Take 0.125 mg by mouth every 4 (four) hours as needed (excess secretions).   Yes [provider]  isosorbide mononitrate (IMDUR) 60 MG 24 hr tablet Take 60 mg by mouth daily.   Yes [provider]  lactulose (CHRONULAC) 10 GM/15ML solution Take 30 g by mouth 2 (two) times daily as needed for mild constipation.   Yes [provider]  Melatonin 5 MG TABS Take 5 mg by mouth at bedtime.   Yes [provider]  Menthol, Topical Analgesic, (ICY HOT EX) Apply 1 application topically 2 (two) times daily.   Yes [provider]  omeprazole (PRILOSEC) 20 MG capsule Take 20 mg by mouth daily.   Yes [provider]  oxyCODONE (OXY IR/ROXICODONE) 5 MG immediate release tablet Take 5 mg by mouth every 4 (four) hours as needed for moderate pain or severe pain.   Yes [provider]  polyethylene glycol (MIRALAX / GLYCOLAX) packet Take 17 g  by mouth every other day.   Yes [provider]  senna (SENOKOT) 8.6 MG tablet Take 2 tablets by mouth daily.    Yes [provider]  traZODone (DESYREL) 100 MG tablet Take 100 mg by mouth at bedtime.   Yes [provider]    Physical Exam: Vitals:   10/29/18 0000 10/29/18 0030 10/29/18 0100 10/29/18 0130  BP: (!) 149/67 (!) 156/67 (!) 157/68 (!) 164/72  Pulse: (!) 59 (!) 59 (!) 59 (!) 59  Resp: 19 (!) 23 20 (!) 22  Temp:      TempSrc:      SpO2: 96% (!) 88% 94% 94%   General: Not in acute distress HEENT:       Eyes: PERRL, EOMI, no scleral icterus.       ENT: No discharge from the ears and nose, no pharynx injection, no tonsillar enlargement.        Neck: No  JVD, no bruit, no mass felt. Heme: No neck lymph node enlargement. Cardiac: S1/S2, RRR, No murmurs, No gallops or rubs. Respiratory: No rales, wheezing, rhonchi or rubs. GI: Soft, nondistended, nontender, no rebound pain, no organomegaly, BS present. GU: No hematuria Ext: 2+ pitting leg edema bilaterally. 2+DP/PT pulse bilaterally. Musculoskeletal: tenderness in left hip.  Skin: No rashes.  Neuro: Alert, oriented X3, cranial nerves II-XII grossly intact, moves all extremities. Psych: Patient is not psychotic, no suicidal or hemocidal ideation.  Labs on Admission: I have personally reviewed following labs and imaging studies  CBC: Recent Labs  Lab 10/28/18 2049  WBC 4.2  NEUTROABS 3.3  HGB 8.3*  HCT 28.1*  MCV 93.7  PLT 115   Basic Metabolic Panel: Recent Labs  Lab 10/28/18 2049  NA 139  K 5.2*  CL 109  CO2 21*  GLUCOSE 115*  BUN 38*  CREATININE 2.31*  CALCIUM 9.2   GFR: CrCl cannot be calculated (Unknown ideal weight.). Liver Function Tests: No results for input(s): AST, ALT, ALKPHOS, BILITOT, PROT, ALBUMIN in the last 168 hours. No results for input(s): LIPASE, AMYLASE in the last 168 hours. No results for input(s): AMMONIA in the last 168 hours. Coagulation  Profile: Recent Labs  Lab 10/28/18 2049  INR 1.01   Cardiac Enzymes: No results for input(s): CKTOTAL, CKMB, CKMBINDEX, TROPONINI in the last 168 hours. BNP (last 3 results) No results for input(s): PROBNP in the last 8760 hours. HbA1C: No results for input(s): HGBA1C in the last 72 hours. CBG: No results for input(s): GLUCAP in the last 168 hours. Lipid Profile: No results for input(s): CHOL, HDL, LDLCALC, TRIG, CHOLHDL, LDLDIRECT in the last 72 hours. Thyroid Function Tests: No results for input(s): TSH, T4TOTAL, FREET4, T3FREE, THYROIDAB in the last 72 hours. Anemia Panel: No results for input(s): VITAMINB12, FOLATE, FERRITIN, TIBC, IRON, RETICCTPCT in the last 72 hours. Urine analysis:    Component Value Date/Time   COLORURINE YELLOW 01/24/2011 1324   APPEARANCEUR CLEAR 01/24/2011 1324   LABSPEC 1.015 01/24/2011 1324   PHURINE 6.0 01/24/2011 1324   GLUCOSEU NEGATIVE 09/05/2010 1038   HGBUR NEGATIVE 01/24/2011 1324   BILIRUBINUR NEGATIVE 01/24/2011 1324   KETONESUR NEGATIVE 01/24/2011 1324   PROTEINUR NEGATIVE 01/24/2011 1324   UROBILINOGEN 0.2 01/24/2011 1324   NITRITE NEGATIVE 01/24/2011 1324   LEUKOCYTESUR  01/24/2011 1324    NEGATIVE MICROSCOPIC NOT DONE ON URINES WITH NEGATIVE PROTEIN, BLOOD, LEUKOCYTES, NITRITE, OR GLUCOSE <1000 mg/dL.   Sepsis Labs: @LABRCNTIP (procalcitonin:4,lacticidven:4) )No results found for this or any previous visit (from the past 240 hour(s)).   Radiological Exams on Admission: Dg Chest 1 View  Result Date: 10/28/2018 CLINICAL DATA:  Fall from wheelchair. EXAM: CHEST  1 VIEW COMPARISON:  January 24, 2011 FINDINGS: Continued elevation the right hemidiaphragm. A new pacemaker terminates with leads in the right atrium and right ventricle. Cardiomegaly. The hila and mediastinum are normal. No pulmonary nodules or masses. IMPRESSION: No active disease. Electronically Signed   By: Dorise Bullion III M.D   On: 10/28/2018 22:52   Dg Hip  Unilat With Pelvis 2-3 Views Left  Result Date: 10/28/2018 CLINICAL DATA:  Fall. EXAM: DG HIP (WITH OR WITHOUT PELVIS) 2-3V LEFT COMPARISON:  None. FINDINGS: There is a fracture of the proximal femur involving the proximal neck. No dislocation. IMPRESSION: Fracture through the proximal left femoral neck with mild displacement. No dislocation. Electronically Signed   By: Dorise Bullion III M.D   On: 10/28/2018 22:51     EKG: Independently  reviewed.  Sinus rhythm, QTC 441, low voltage, poor R wave progression, anteroseptal infarction pattern, LAD.  Assessment/Plan Principal Problem:   Closed displaced fracture of left femoral neck (HCC) Active Problems:   HYPERTENSION, MILD   Coronary atherosclerosis   Status post placement of cardiac pacemaker   Iron deficiency anemia   Gout   GERD (gastroesophageal reflux disease)   CKD (chronic kidney disease), stage IV (HCC)   Chronic systolic (congestive) heart failure (HCC)   Fall   Hyperkalemia   Closed displaced fracture of left femoral neck ([place]): As evidenced by x-ray. Patient has severe pain now. No neurovascular compromise. Orthopedic surgeon, Dr. Mardelle Matte was consulted. Pt has complicated cardiac history, including dCHF, CAD, pacemaker placement.  Patient has 2+ leg edema indicating fluid overload, but kidney function is worsened than baseline, cannot escalate diuretics, difficult situation.  I will ask cardiologist to help in presurgical clearance.  - will admit to Med-surg bed - Pain control: morphine prn and percocet - Continue home fentanyl patch - When necessary Zofran for nausea - Robaxin for muscle spasm -  type and cross - INR/PTT - PT/OT when able to (not ordered now) - Cardiology consultation was requested via epic  HTN:  -Continue home medications: Amlodipine, Coreg -Also on Lasix -IV hydralazine prn  Hx of Coronary atherosclerosis: no CP -Continue aspirin, Amaryl, Coreg  Iron deficiency anemia: Hemoglobin stable.   8.0 on 09/03/2018--> 8.3 today. -Follow-up by CBC  Gout: -Allopurinol  GERD (gastroesophageal reflux disease): -Protonix  CKD (chronic kidney disease), stage IV ([place]): Baseline creatinine 1.8-2.0.  Her creatinine is at 2.31, BUN 38, slightly worsening in the baseline.  Likely due to dehydration and continuation of diuretics.  Patient already has fluid overload with 2+ leg edema, will not hold diuretics now. -Monitor kidney function closely by the map.  Chronic systolic (congestive) heart failure ([place]): 2D echo on 07/28/2014 showed EF 50%.  Patient has 2+ leg edema, but no respiratory distress.  Patient has fluid overload, but does not have acute CHF exacerbation. -Continue home Lasix -BNP  Fall: Patient seems to have had a mechanical fall.  Strongly denies head or neck injury.  Refused CT scan of head and neck. -PT/OT when able to  Hyperkalemia: -Kayexalate 15 g x 1    Inpatient status:  # Patient requires inpatient status due to high intensity of service, high risk for further deterioration and high frequency of surveillance required.  I certify that at the point of admission it is my clinical judgment that the patient will require inpatient hospital care spanning beyond 2 midnights from the point of admission.  . This patient has multiple chronic comorbidities including hypertension, CAD, PAD, CHF, bradycardia, CKD 4, pacemaker placement, GERD, gout, depression, iron deficiency anemia, colon cancer (s/p of partial colectomy, no XTR or chemotherapy). . Now patient has presenting symptoms include fall and left hip Fx and pain . The worrisome physical exam findings include left hip pain, 2+ leg edema . The initial radiographic and laboratory data are worrisome because of hyperkalemia, worsening renal function, displaced left femoral neck fracture by x-ray. . Current medical needs: please see my assessment and plan . Predictability of an adverse outcome (risk): Patient has multiple  comorbidities, now presents with left hip fracture, will need surgery.  Patient will need to stay in hospital at least for 2 days.      DVT ppx: SCD Code Status: DNR (I discussed with patient in the presence of her daughter who is POA, and explained the  meaning of CODE STATUS. Patient wants to be DNR) Family Communication:  Yes, patient's daughter at bed side Disposition Plan:  Anticipate discharge back to previous SNF Consults called:  Dr. Mardelle Matte Admission status: Inpatient/tele       Date of Service 10/29/2018    Ivor Costa [facility] Pager [phone]  If 7PM-7AM, please contact night-coverage [email or web address] Password [email or web address] 10/29/2018, 2:21 AM

## 2018-10-29 NOTE — Consult Note (Signed)
Reason for Consult:Left hip fx Referring Physician: C Ramani Laura Houston is an 84 y.o. female.  HPI: Laura Houston was getting up from her chair at bingo when she lost her balance and fell onto her left side. She had immediate pain and could not get up. She was brought to the ED where x-rays showed a left femoral neck fx and orthopedic surgery was consulted. She lives in a SNF with her daughter.  Past Medical History:  Diagnosis Date  . Bradycardia   . CAD (coronary artery disease)   . Chronic systolic (congestive) heart failure ([place])   . CKD (chronic kidney disease), stage IV ([facility])   . Colon cancer [facility])    s/p of colectomy  . GERD (gastroesophageal reflux disease)   . Gout   . Hospice care   . Iron deficiency anemia   . Status post placement of cardiac pacemaker     Past Surgical History:  Procedure Laterality Date  . ABDOMINAL HYSTERECTOMY    . PARTIAL COLECTOMY      Family History  Problem Relation Age of Onset  . AAA (abdominal aortic aneurysm) Mother     Social History:  reports that she has never smoked. She has never used smokeless tobacco. She reports that she does not drink alcohol or use drugs.  Allergies:  Allergies  Allergen Reactions  . Doxycycline Other (See Comments)    On MAR  . Penicillins Other (See Comments)    Has patient had a PCN reaction causing immediate rash, facial/tongue/throat swelling, SOB or lightheadedness with hypotension: Unknown Has patient had a PCN reaction causing severe rash involving mucus membranes or skin necrosis: Unknown Has patient had a PCN reaction that required hospitalization: Unknown Has patient had a PCN reaction occurring within the last 10 years: Unknown If all of the above answers are "NO", then may proceed with Cephalosporin use.   . Sulfonamide Derivatives     Medications: I have reviewed the patient's current medications.  Results for orders placed or performed during the hospital encounter of 10/28/18  (from the past 48 hour(s))  Type and screen [place]     Status: None (Preliminary result)   Collection Time: 10/28/18  8:40 PM  Result Value Ref Range   ABO/RH(D) B POS    Antibody Screen NEG    Sample Expiration      10/31/2018 Performed at [facility], [place] [address], [place], [place] [address]    Unit Number [id number]    Blood Component Type RED CELLS,LR    Unit division 00    Status of Unit ALLOCATED    Transfusion Status OK TO TRANSFUSE    Crossmatch Result Compatible    Unit Number [id number]    Blood Component Type RED CELLS,LR    Unit division 00    Status of Unit ALLOCATED    Transfusion Status OK TO TRANSFUSE    Crossmatch Result Compatible   Basic metabolic panel     Status: Abnormal   Collection Time: 10/28/18  8:49 PM  Result Value Ref Range   Sodium 139 135 - 145 mmol/L   Potassium 5.2 (H) 3.5 - 5.1 mmol/L   Chloride 109 98 - 111 mmol/L   CO2 21 (L) 22 - 32 mmol/L   Glucose, Bld 115 (H) 70 - 99 mg/dL   BUN 38 (H) 8 - 23 mg/dL   Creatinine, Ser 2.31 (H) 0.44 - 1.00 mg/dL   Calcium 9.2 8.9 - 10.3 mg/dL  GFR calc non Af Amer 17 (L) >60 mL/min   GFR calc Af Amer 20 (L) >60 mL/min    Comment: (NOTE) The eGFR has been calculated using the CKD EPI equation. This calculation has not been validated in all clinical situations. eGFR's persistently <60 mL/min signify possible Chronic Kidney Disease.    Anion gap 9 5 - 15    Comment: Performed at [facility] [address] [place], [place] [address]  CBC WITH DIFFERENTIAL     Status: Abnormal   Collection Time: 10/28/18  8:49 PM  Result Value Ref Range   WBC 4.2 4.0 - 10.5 K/uL   RBC 3.00 (L) 3.87 - 5.11 MIL/uL   Hemoglobin 8.3 (L) 12.0 - 15.0 g/dL   HCT 28.1 (L) 36.0 - 46.0 %   MCV 93.7 80.0 - 100.0 fL   MCH 27.7 26.0 - 34.0 pg   MCHC 29.5 (L) 30.0 - 36.0 g/dL   RDW 15.6 (H) 11.5 - 15.5 %   Platelets 178 150 - 400 K/uL   nRBC 0.0 0.0 - 0.2 %   Neutrophils Relative  % 77 %   Neutro Abs 3.3 1.7 - 7.7 K/uL   Lymphocytes Relative 16 %   Lymphs Abs 0.7 0.7 - 4.0 K/uL   Monocytes Relative 6 %   Monocytes Absolute 0.2 0.1 - 1.0 K/uL   Eosinophils Relative 0 %   Eosinophils Absolute 0.0 0.0 - 0.5 K/uL   Basophils Relative 0 %   Basophils Absolute 0.0 0.0 - 0.1 K/uL   Immature Granulocytes 1 %   Abs Immature Granulocytes 0.02 0.00 - 0.07 K/uL    Comment: Performed at [facility], [place] [address], [place], [place] [address]  Protime-INR     Status: None   Collection Time: 10/28/18  8:49 PM  Result Value Ref Range   Prothrombin Time 13.2 11.4 - 15.2 seconds   INR 1.01     Comment: Performed at [facility], [place] [address] [place], [place] [address]  Prepare RBC (crossmatch)     Status: None   Collection Time: 10/29/18  2:39 AM  Result Value Ref Range   Order Confirmation      ORDER PROCESSED BY BLOOD BANK Performed at [facility], [place] [address], [place], [place] [address]   Brain natriuretic peptide     Status: Abnormal   Collection Time: 10/29/18  2:40 AM  Result Value Ref Range   B Natriuretic Peptide 580.4 (H) 0.0 - 100.0 pg/mL    Comment: Performed at [facility] [address] [place], [place] [address]  APTT     Status: None   Collection Time: 10/29/18  2:40 AM  Result Value Ref Range   aPTT 34 24 - 36 seconds    Comment: Performed at [facility] [address] [place], [place] [address]  Surgical pcr screen     Status: None   Collection Time: 10/29/18  3:26 AM  Result Value Ref Range   MRSA, PCR NEGATIVE NEGATIVE   Staphylococcus aureus NEGATIVE NEGATIVE    Comment: (NOTE) The Xpert SA Assay (FDA approved for NASAL specimens in patients 1 years of age and older), is one component of a comprehensive surveillance program. It is not intended to diagnose infection nor to guide or monitor treatment. Performed at [facility], [place] [address] [place], [place] [address]     Dg Chest 1  View  Result Date:  10/28/2018 CLINICAL DATA:  Fall from wheelchair. EXAM: CHEST  1 VIEW COMPARISON:  January 24, 2011 FINDINGS: Continued elevation the right hemidiaphragm. A new pacemaker terminates with leads in the right atrium and right ventricle. Cardiomegaly. The hila and mediastinum are normal. No pulmonary nodules or masses. IMPRESSION: No active disease. Electronically Signed   By: Dorise Bullion III M.D   On: 10/28/2018 22:52   Dg Hip Unilat With Pelvis 2-3 Views Left  Result Date: 10/28/2018 CLINICAL DATA:  Fall. EXAM: DG HIP (WITH OR WITHOUT PELVIS) 2-3V LEFT COMPARISON:  None. FINDINGS: There is a fracture of the proximal femur involving the proximal neck. No dislocation. IMPRESSION: Fracture through the proximal left femoral neck with mild displacement. No dislocation. Electronically Signed   By: Dorise Bullion III M.D   On: 10/28/2018 22:51    Review of Systems  Constitutional: Negative for weight loss.  HENT: Negative for ear discharge, ear pain, hearing loss and tinnitus.   Eyes: Negative for blurred vision, double vision, photophobia and pain.  Respiratory: Negative for cough, sputum production and shortness of breath.   Cardiovascular: Negative for chest pain.  Gastrointestinal: Negative for abdominal pain, nausea and vomiting.  Genitourinary: Negative for dysuria, flank pain, frequency and urgency.  Musculoskeletal: Positive for joint pain (Left hip). Negative for back pain, falls, myalgias and neck pain.  Neurological: Negative for dizziness, tingling, sensory change, focal weakness, loss of consciousness and headaches.  Endo/Heme/Allergies: Does not bruise/bleed easily.  Psychiatric/Behavioral: Negative for depression, memory loss and substance abuse. The patient is not nervous/anxious.    Blood pressure (!) 156/72, pulse 76, temperature 99.3 F (37.4 C), temperature source Oral, resp. rate 18, SpO2 (!) 89 %. Physical Exam  Constitutional: She appears  well-developed and well-nourished. No distress.  HENT:  Head: Normocephalic and atraumatic.  Eyes: Conjunctivae are normal. Right eye exhibits no discharge. Left eye exhibits no discharge. No scleral icterus.  Neck: Normal range of motion.  Cardiovascular: Normal rate and regular rhythm.  Respiratory: Effort normal. No respiratory distress.  Musculoskeletal:  LLE No traumatic wounds, ecchymosis, or rash  TTP hip  No knee or ankle effusion  Knee stable to varus/ valgus and anterior/posterior stress  Sens DPN, SPN, TN intact  Motor EHL, ext, flex, evers 5/5  DP 0, PT 0, No significant edema  Neurological: She is alert.  Skin: Skin is warm and dry. She is not diaphoretic.  Psychiatric: She has a normal mood and affect. Her behavior is normal.    Assessment/Plan: Left hip fx -- For hemiarthroplasty today by Dr. Marcelino Scot. Please keep NPO until then. PT/OT to start tomorrow. Multiple medical problems including hypertension, CAD, PAD, CHF, bradycardia, CKD 4, pacemaker placement, GERD, gout, depression, iron deficiency anemia, and colon cancer (s/p of partial colectomy, no XTR or chemotherapy) -- per primary team    Lisette Abu, PA-C Orthopedic Surgery [phone] 10/29/2018, 9:00 AM

## 2018-10-29 NOTE — ED Provider Notes (Signed)
[facility] EMERGENCY DEPARTMENT Provider Note   CSN: [id number] Arrival date & time: 10/28/18  2028     History   Chief Complaint Chief Complaint  Patient presents with  . Leg Pain    HPI Laura Houston is a 84 y.o. female.   Leg Pain   This is a new problem. The current episode started 3 to 5 hours ago. The problem occurs constantly. The problem has not changed since onset.The pain is present in the left hip. The quality of the pain is described as sharp. The pain is moderate. Associated symptoms include limited range of motion. Exacerbated by: movement. She has tried nothing for the symptoms.    Past Medical History:  Diagnosis Date  . Bradycardia   . CAD (coronary artery disease)   . Chronic systolic (congestive) heart failure ([place])   . CKD (chronic kidney disease), stage IV ([place])   . GERD (gastroesophageal reflux disease)   . Gout   . Hospice care   . Iron deficiency anemia   . Status post placement of cardiac pacemaker     Patient Active Problem List   Diagnosis Date Noted  . Fall 10/29/2018  . Hyperkalemia 10/29/2018  . Closed left hip fracture ([place]) 10/29/2018  . Closed displaced fracture of left femoral neck ([place]) 10/29/2018  . Status post placement of cardiac pacemaker   . Iron deficiency anemia   . Hospice care   . Gout   . GERD (gastroesophageal reflux disease)   . CKD (chronic kidney disease), stage IV ([place])   . Chronic systolic (congestive) heart failure ([place])   . CAD (coronary artery disease)   . Bradycardia   . HYPERTENSION, MILD 08/26/2010  . BRADYCARDIA 08/26/2010  . EDEMA 08/26/2010  . Coronary atherosclerosis 08/24/2010    No past surgical history on file.   OB History   None      Home Medications    Prior to Admission medications   Medication Sig Start Date End Date Taking? Authorizing Provider  acetaminophen (TYLENOL) 500 MG tablet Take 500 mg by mouth every 6 (six) hours as needed for mild pain,  moderate pain or headache.   Yes [provider]  acetaminophen (TYLENOL) 500 MG tablet Take 500 mg by mouth at bedtime.   Yes [provider]  allopurinol (ZYLOPRIM) 100 MG tablet Take 100 mg by mouth daily.   Yes [provider]  amLODipine (NORVASC) 5 MG tablet Take 5 mg by mouth daily.   Yes [provider]  aspirin 81 MG chewable tablet Chew 81 mg by mouth daily.   Yes [provider]  carvedilol (COREG) 12.5 MG tablet Take 12.5 mg by mouth 2 (two) times daily with a meal.   Yes [provider]  cholecalciferol (VITAMIN D) 1000 units tablet Take 1,000 Units by mouth daily.   Yes [provider]  escitalopram (LEXAPRO) 10 MG tablet Take 10 mg by mouth daily.   Yes [provider]  fentaNYL (DURAGESIC - DOSED MCG/HR) 75 MCG/HR Place 75 mcg onto the skin every 3 (three) days.   Yes [provider]  ferrous sulfate 325 (65 FE) MG EC tablet Take 325 mg by mouth daily with breakfast.   Yes [provider]  furosemide (LASIX) 40 MG tablet Take 40 mg by mouth at bedtime.   Yes [provider]  hyoscyamine (LEVSIN, ANASPAZ) 0.125 MG tablet Take 0.125 mg by mouth every 4 (four) hours as needed (excess secretions).   Yes  [provider]  isosorbide mononitrate (IMDUR) 60 MG 24 hr tablet Take 60 mg by mouth daily.   Yes [provider]  lactulose (CHRONULAC) 10 GM/15ML solution Take 30 g by mouth 2 (two) times daily as needed for mild constipation.   Yes [provider]  Melatonin 5 MG TABS Take 5 mg by mouth at bedtime.   Yes [provider]  Menthol, Topical Analgesic, (ICY HOT EX) Apply 1 application topically 2 (two) times daily.   Yes [provider]  omeprazole (PRILOSEC) 20 MG capsule Take 20 mg by mouth daily.   Yes [provider]  oxyCODONE (OXY IR/ROXICODONE) 5 MG immediate release tablet Take 5 mg by mouth every 4 (four) hours as needed for  moderate pain or severe pain.   Yes [provider]  polyethylene glycol (MIRALAX / GLYCOLAX) packet Take 17 g by mouth every other day.   Yes [provider]  senna (SENOKOT) 8.6 MG tablet Take 2 tablets by mouth daily.    Yes [provider]  traZODone (DESYREL) 100 MG tablet Take 100 mg by mouth at bedtime.   Yes [provider]    Family History No family history on file.  Social History Social History   Tobacco Use  . Smoking status: Never Smoker  . Smokeless tobacco: Never Used  Substance Use Topics  . Alcohol use: Never    Frequency: Never  . Drug use: Never     Allergies   Doxycycline; Penicillins; and Sulfonamide derivatives   Review of Systems Review of Systems  All other systems reviewed and are negative.    Physical Exam Updated Vital Signs BP (!) 149/67   Pulse (!) 59   Temp 99.1 F (37.3 C) (Oral)   Resp 19   SpO2 96%   Physical Exam  Constitutional: She appears well-developed and well-nourished.  HENT:  Head: Normocephalic and atraumatic.  Eyes: Conjunctivae and EOM are normal.  Neck: Normal range of motion.  Cardiovascular: Normal rate and regular rhythm.  Pulmonary/Chest: No stridor. No respiratory distress.  Abdominal: Soft. She exhibits no distension.  Musculoskeletal:  No cervical spine tenderness, thoracic spine tenderness or Lumbar spine tenderness.  No tenderness or pain with palpation and full ROM of all joints in upper and lower extremities aside from left hip.  No ecchymosis or other signs of trauma on back or extremities.  No Pain with AP or lateral compression of ribs.  No paracervical ttp, paraspinal ttp   Neurological: She is alert.  Skin: Skin is warm and dry.  Nursing note and vitals reviewed.    ED Treatments / Results  Labs (all labs ordered are listed, but only abnormal results are displayed) Labs Reviewed  BASIC METABOLIC PANEL - Abnormal; Notable for the following components:       Result Value   Potassium 5.2 (*)    CO2 21 (*)    Glucose, Bld 115 (*)    BUN 38 (*)    Creatinine, Ser 2.31 (*)    GFR calc non Af Amer 17 (*)    GFR calc Af Amer 20 (*)    All other components within normal limits  CBC WITH DIFFERENTIAL/PLATELET - Abnormal; Notable for the following components:   RBC 3.00 (*)    Hemoglobin 8.3 (*)    HCT 28.1 (*)    MCHC 29.5 (*)    RDW 15.6 (*)    All other components within normal limits  PROTIME-INR  TYPE AND SCREEN  EKG EKG Interpretation  Date/Time:  Monday October 28 2018 20:52:19 EST Ventricular Rate:  60 PR Interval:    QRS Duration: 101 QT Interval:  441 QTC Calculation: 441 R Axis:   -52 Text Interpretation:  Sinus or ectopic atrial rhythm Inferior infarct, old Probable anterior infarct, age indeterminate Confirmed by Merrily Pew [phone]) on 10/28/2018 10:05:50 PM   Radiology Dg Chest 1 View  Result Date: 10/28/2018 CLINICAL DATA:  Fall from wheelchair. EXAM: CHEST  1 VIEW COMPARISON:  January 24, 2011 FINDINGS: Continued elevation the right hemidiaphragm. A new pacemaker terminates with leads in the right atrium and right ventricle. Cardiomegaly. The hila and mediastinum are normal. No pulmonary nodules or masses. IMPRESSION: No active disease. Electronically Signed   By: Dorise Bullion III M.D   On: 10/28/2018 22:52   Dg Hip Unilat With Pelvis 2-3 Views Left  Result Date: 10/28/2018 CLINICAL DATA:  Fall. EXAM: DG HIP (WITH OR WITHOUT PELVIS) 2-3V LEFT COMPARISON:  None. FINDINGS: There is a fracture of the proximal femur involving the proximal neck. No dislocation. IMPRESSION: Fracture through the proximal left femoral neck with mild displacement. No dislocation. Electronically Signed   By: Dorise Bullion III M.D   On: 10/28/2018 22:51    Procedures Procedures (including critical care time)  Medications Ordered in ED Medications  traZODone (DESYREL) tablet 100 mg (100 mg Oral Given 10/29/18 0006)  sodium  polystyrene (KAYEXALATE) 15 GM/60ML suspension 15 g (has no administration in time range)  fentaNYL (SUBLIMAZE) injection 50 mcg (50 mcg Intravenous Given 10/28/18 2313)  fentaNYL (SUBLIMAZE) injection 50 mcg (50 mcg Intravenous Given 10/29/18 0006)     Initial Impression / Assessment and Plan / ED Course  I have reviewed the triage vital signs and the nursing notes.  Pertinent labs & imaging results that were available during my care of the patient were reviewed by me and considered in my medical decision making (see chart for details).    Left femoral neck fracture. Discussed with Dr. Mardelle Matte, plan for surgery AM. Discussed with Dr. Blaine Hamper for admission.  Final Clinical Impressions(s) / ED Diagnoses   Final diagnoses:  Closed fracture of left hip, initial encounter [facility])    ED Discharge Orders    None       Yosgar Demirjian, Corene Cornea, MD 10/29/18 [phone]

## 2018-10-29 NOTE — Anesthesia Procedure Notes (Signed)
Procedure Name: [place] Performed by: Milford Cage, CRNA Ventilation: Nasal airway inserted- appropriate to patient size

## 2018-10-29 NOTE — ED Notes (Addendum)
Fentanyl 46mcg patch removed from pt's L upper back placed at the nsg facility where pt resides

## 2018-10-29 NOTE — ED Notes (Signed)
Niu MD Hospitalist is at bedside to admit pt

## 2018-10-29 NOTE — Progress Notes (Signed)
Patient seen and examined this morning, H&P reviewed and agree with the assessment and plan.  In brief, this is a pleasant 84 year old female with history of hypertension, CAD, PAD, CHF, bradycardia, chronic kidney disease stage IV, pacemaker who was in her normal state of health at the assisted living facility and fell from wheelchair while trying to transfer.  She was found to have left femoral neck fracture, orthopedic surgery was consulted and we admitted the patient   BP (!) 156/72 (BP Location: Right Arm)   Pulse 76   Temp 99.3 F (37.4 C) (Oral)   Resp 18   SpO2 95%   Constitutional: NAD, calm, comfortable Eyes: PERRL ENMT: Mucous membranes are moist.  Respiratory: clear to auscultation bilaterally, no wheezing, no crackles. Normal respiratory effort.  Cardiovascular: Regular rate and rhythm, no murmurs / rubs / gallops.  1+ LE edema. 2+ pedal pulses.  Abdomen: no tenderness. Bowel sounds positive.  Neurologic: non focal   Fracture of the left femoral neck -Orthopedic surgery to take to the OR today  Hypertension -Continue home medications  Acute on chronic diastolic CHF -With 2+ pitting edema on admission, received IV Lasix, edema much improved this morning  CAD -continue home medications, no chest pain  Iron deficiency anemia -hemoglobin stable  Gout -Continue allopurinol   Scheduled Meds: . allopurinol  100 mg Oral Daily  . amLODipine  5 mg Oral Daily  . aspirin  81 mg Oral Daily  . carvedilol  12.5 mg Oral BID WC  . chlorhexidine  60 mL Topical Once  . cholecalciferol  1,000 Units Oral Daily  . escitalopram  10 mg Oral Daily  . fentaNYL  75 mcg Transdermal Q72H  . ferrous sulfate  325 mg Oral Q breakfast  . furosemide  40 mg Oral QHS  . isosorbide mononitrate  60 mg Oral Daily  . Melatonin  6 mg Oral QHS  . MUSCLE RUB  1 application Topical BID  . pantoprazole  40 mg Oral Daily  . povidone-iodine  2 application Topical Once  . senna  2 tablet Oral  Daily  . traZODone  100 mg Oral QHS   Continuous Infusions: .  ceFAZolin (ANCEF) IV     PRN Meds:.acetaminophen, hydrALAZINE, methocarbamol, morphine injection, ondansetron (ZOFRAN) IV, oxyCODONE-acetaminophen, polyethylene glycol, zolpidem    Axel Frisk M. Cruzita Lederer, MD, PhD [facility] Pager [phone]  If 7PM-7AM, please contact night-coverage [email or web address] Password [email or web address]

## 2018-10-29 NOTE — Progress Notes (Signed)
Initial Nutrition Assessment  DOCUMENTATION CODES:   Severe malnutrition in context of chronic illness  INTERVENTION:  Ensure Enlive TID Magic Cup BID lunch and dinner  MVI  NUTRITION DIAGNOSIS:   Severe Malnutrition related to chronic illness(COPD, CHF, CKD 4) as evidenced by severe fat depletion, severe muscle depletion.  GOAL:   Patient will meet greater than or equal to 90% of their needs  MONITOR:   PO intake, Supplement acceptance  REASON FOR ASSESSMENT:   Consult Hip fracture protocol  ASSESSMENT:   Pt with PMH of HTN, COPD, PAD,CHF, GERD,CKD Stage 4, and pacemaker living in SNF. Admitted due to fall from wheelchair while playing bingo fracturing left femur.   Plans for hip repair today.   Pt somewhat alert and oriented when I entered the room but she was able to answer my questions. Family member was at bedside and able to fill in the gaps.   Pt stated that she normally eats well at SNF at least 3 times a day.For breakfast pt usually has some oatmeal and dehydrated eggs. For lunch pt usually has a tuna fish sandwich, potato salad or soup. For dinner pt has a sandwich or soup as well. Pt mentioned that she used to drink Ensures at home and likes chocolate flavor, but doesn't get them at [facility]. Pt mentioned that she kept a couple of snacks in her room as well, but was unable to recall specfics.  Pt reported that she had lost 3-4# over the last few weeks. Pt stated that her usual bodyweight is around 150#. Current weight unavailable.    Medications reviewed and include: Vitamin D3 1,000 units/d Ferrous Sulfate 325 mg/d Lasix 21mcg/d Senna 34.4 mcg/d Fentanyl 75 mcg patch every 3d  Labs: BNP 580.4 (H) Glucose 115 (H)  Creatine 2.31 (H) Potassium 5.2 (H)  NUTRITION - FOCUSED PHYSICAL EXAM:    Most Recent Value  Orbital Region  No depletion  Upper Arm Region  Severe depletion  Thoracic and Lumbar Region  Unable to assess [pt declined]  Buccal Region   Severe depletion  Temple Region  Severe depletion  Clavicle Bone Region  Severe depletion  Clavicle and Acromion Bone Region  Severe depletion  Scapular Bone Region  Unable to assess  Dorsal Hand  Moderate depletion  Patellar Region  Unable to assess [pt declined]  Anterior Thigh Region  Unable to assess [pt declined]  Posterior Calf Region  Unable to assess [pt declined]  Edema (RD Assessment)  Mild  Hair  Unable to assess Charma Igo bonnet ]  Eyes  Reviewed  Mouth  Reviewed  Skin  Reviewed  Nails  Reviewed       Diet Order:   Diet Order            Diet NPO time specified Except for: Sips with Meds  Diet effective midnight        Diet NPO time specified Except for: [employer], Sips with Meds  Diet effective now              EDUCATION NEEDS:   No education needs have been identified at this time  Skin:  Skin Assessment: Reviewed RN Assessment  Last BM:  11/18  Height:   Ht Readings from Last 1 Encounters:  09/19/18 5\' 5"  (1.651 m)    Weight:   Wt Readings from Last 1 Encounters:  09/19/18 71.7 kg    Ideal Body Weight:  56.9 kg  BMI: 26.3 Per most recent height and weight readings.  Estimated Nutritional Needs:   Kcal:  1700-1900  Protein:  90-100 grams  Fluid:  >1.7L   Mauricia Area, MS, Dietetic Intern Pager: [phone] After hours Pager: [phone]

## 2018-10-29 NOTE — Transfer of Care (Signed)
Immediate Anesthesia Transfer of Care Note  Patient: Laura Houston  Procedure(s) Performed: LEFT HIP HEMIARTHROPLASTY (Left Hip)  Patient Location: PACU  Anesthesia Type:General  Level of Consciousness: sedated and patient cooperative  Airway & Oxygen Therapy: Patient Spontanous Breathing and Patient connected to face mask oxygen  Post-op Assessment: Report given to RN and Post -op Vital signs reviewed and stable  Post vital signs: Reviewed and stable  Last Vitals:  Vitals Value Taken Time  BP 162/76 10/29/2018  5:19 PM  Temp    Pulse 66 10/29/2018  5:21 PM  Resp 21 10/29/2018  5:21 PM  SpO2 99 % 10/29/2018  5:21 PM  Vitals shown include unvalidated device data.  Last Pain:  Vitals:   10/29/18 0849  TempSrc: Oral  PainSc:       Patients Stated Pain Goal: 0 (73/08/56 9437)  Complications: No apparent anesthesia complications

## 2018-10-29 NOTE — Op Note (Signed)
10/29/2018  5:19 PM  PATIENT:  Laura Houston  84 y.o. female  PRE-OPERATIVE DIAGNOSIS:  DISPLACED LEFT FEMORAL NECK FRACTURE  POST-OPERATIVE DIAGNOSIS:  DISPLACED LEFT FEMORAL NECK FRACTURE  PROCEDURE:  Procedure(s): LEFT HIP HEMIARTHROPLASTY (Left) with DePuy [place] Basic #7 stem, neutral neck, 50 mm head  SURGEON:  Surgeon(s) and Role:    Altamese [place], MD - Primary  ASSISTANTS: Laure Kidney, RNFA   ANESTHESIA:   Spinal  EBL:  350 mL   BLOOD ADMINISTERED:none  DRAINS: none   LOCAL MEDICATIONS USED:  NONE  SPECIMEN:  No Specimen  DISPOSITION OF SPECIMEN:  N/A  COUNTS:  YES  TOURNIQUET:  * No tourniquets in log *  DICTATION: .Note written in EPIC  PLAN OF CARE: Admit to inpatient   PATIENT DISPOSITION:  PACU - hemodynamically stable.   Delay start of Pharmacological VTE agent (>24hrs) due to surgical blood loss or risk of bleeding: no  BRIEF SUMMARY OF INDICATION FOR PROCEDURE:  Laura Houston is a very pleasant 84 y.o. ambulator with a walker who sustained fall after getting up rapidly to yell, "Bingo," with subsequent inability to bear weight, shortening, and external rotation of the extremity.  She was seen and evaluated with the recommendation for hemiarthroplasty. I discussed with the patient and family the risks and benefits, inclding the potential for leg length inequality, dislocation or instability, arthritis, loss of motion, DVT, PE, heart attack, stroke, and death.  Consent was given to proceed.  BRIEF SUMMARY OF PROCEDURE:  The patient was taken to the operating room where general anesthesia was induced and after administration of preoperative antibiotics using Vancomycin.  She was positioned with the left side up and all prominences were padded appropriately.  We made a 10 cm incision after the time-out, carrying dissection down to the IT band, was split in line with the skin.  Cerebellar retractor was placed and we were able to then  flex and internally rotate the hip releasing the piriformis and short rotators at their insertions.  The capsule was then T'd, tagging the corners with #1 Vicryl.  The neck cut was refined using a cutting guide and then this was followed by removal of the head, which sized perfectly to 50 mm. Acetabular trials were placed, confirming this size as the best fit. Mueller and Cobra retractors were placed along the proximal femur, which was then prepared with the canal finder,  then lateralizer, followed by reamers up to #7, and the broaches, achieving  outstanding fit and fill with the #7 broach.  The calcar reamer was used to refine the cut as we were using a low demand stem.  The canal was irrigated thoroughly and the acetabulum once again searched multiple times for fragments and irrigated thoroughly.  Trial components were placed and the patient had outstanding stability in combine 90 degrees of flexion, adduction, and internal rotation as well as in external rotation and extension.  Consequently, actual components were placed.  My assistant was necessary for delivery and control of the proximal femur during preparation, also during relocation and dislocation of the trial components as well as relocation of the actual components. I did repair the capsule with #1 Vicryl and then used #2 FiberWire through bone tunnels to repair the short rotators and piriformis.  This was followed by a #1 Vicryl for the IT band and lastly 2-0 Vicryl and nylon for the subcutaneous and skin.  Sterile gently compressive dressing was applied.  The patient was awakened from anesthesia and transported  to the PACU in stable condition.  PROGNOSIS:  The patient will be weightbearing as tolerated with posterior hip precautions.  Patient has an elevated risk of complications related to age and mobility.  Laura Houston remains on the Medical Service and will be on DVT prophylaxis mechanically and with  Lovenox.     Laura Houston. Marcelino Scot, M.D.

## 2018-10-29 NOTE — Progress Notes (Signed)
Pre-op report called and given to Manuela Schwartz, RN in Short Stay. All questions answered to satisfaction. OR personnel to transport pt. Will continue to monitor.

## 2018-10-29 NOTE — Anesthesia Postprocedure Evaluation (Signed)
Anesthesia Post Note  Patient: Laura Houston  Procedure(s) Performed: LEFT HIP HEMIARTHROPLASTY (Left Hip)     Patient location during evaluation: PACU Anesthesia Type: Spinal Level of consciousness: oriented and awake and alert Pain management: pain level controlled Vital Signs Assessment: post-procedure vital signs reviewed and stable Respiratory status: spontaneous breathing, respiratory function stable and patient connected to nasal cannula oxygen Cardiovascular status: blood pressure returned to baseline and stable Postop Assessment: no headache, no backache and no apparent nausea or vomiting Anesthetic complications: no    Last Vitals:  Vitals:   10/29/18 1800 10/29/18 1805  BP:  (!) 166/79  Pulse: 76 74  Resp: 20 (!) 23  Temp:    SpO2: 92% 94%    Last Pain:  Vitals:   10/29/18 1800  TempSrc:   PainSc: 0-No pain                 Avanish Cerullo COKER

## 2018-10-30 ENCOUNTER — Encounter (HOSPITAL_COMMUNITY): Payer: Self-pay | Admitting: Orthopedic Surgery

## 2018-10-30 LAB — CBC
HCT: 22.3 % — ABNORMAL LOW (ref 36.0–46.0)
Hemoglobin: 6.7 g/dL — CL (ref 12.0–15.0)
MCH: 27.5 pg (ref 26.0–34.0)
MCHC: 30 g/dL (ref 30.0–36.0)
MCV: 91.4 fL (ref 80.0–100.0)
PLATELETS: 142 10*3/uL — AB (ref 150–400)
RBC: 2.44 MIL/uL — ABNORMAL LOW (ref 3.87–5.11)
RDW: 15.3 % (ref 11.5–15.5)
WBC: 4.6 10*3/uL (ref 4.0–10.5)
nRBC: 0.4 % — ABNORMAL HIGH (ref 0.0–0.2)

## 2018-10-30 LAB — BASIC METABOLIC PANEL
Anion gap: 9 (ref 5–15)
BUN: 32 mg/dL — ABNORMAL HIGH (ref 8–23)
CALCIUM: 8.8 mg/dL — AB (ref 8.9–10.3)
CO2: 24 mmol/L (ref 22–32)
CREATININE: 2.05 mg/dL — AB (ref 0.44–1.00)
Chloride: 106 mmol/L (ref 98–111)
GFR calc non Af Amer: 20 mL/min — ABNORMAL LOW (ref >60)
GFR, EST AFRICAN AMERICAN: 23 mL/min — AB (ref >60)
Glucose, Bld: 107 mg/dL — ABNORMAL HIGH (ref 70–99)
Potassium: 4.5 mmol/L (ref 3.5–5.1)
SODIUM: 139 mmol/L (ref 135–145)

## 2018-10-30 LAB — PREPARE RBC (CROSSMATCH)

## 2018-10-30 LAB — HEMOGLOBIN AND HEMATOCRIT, BLOOD
HCT: 25.4 % — ABNORMAL LOW (ref 36.0–46.0)
Hemoglobin: 7.9 g/dL — ABNORMAL LOW (ref 12.0–15.0)

## 2018-10-30 MED ORDER — SODIUM CHLORIDE 0.9% IV SOLUTION
Freq: Once | INTRAVENOUS | Status: AC
  Administered 2018-10-30: 09:00:00 via INTRAVENOUS

## 2018-10-30 NOTE — Progress Notes (Signed)
PROGRESS NOTE  Laura Houston NFA:[id number] DOB: [birth year] DOA: 10/28/2018 PCP: Patient, No Pcp Per   LOS: 1 day   Brief Narrative / Interim history: 84 year old female with history of hypertension, CAD, PAD, CHF, bradycardia, chronic kidney disease stage IV, pacemaker who was in her normal state of health at the assisted living facility and fell from wheelchair while trying to transfer.  She was found to have left femoral neck fracture, orthopedic surgery was consulted and we admitted the patient  Subjective: -Denies any pain this morning, no chest discomfort, no shortness of breath, no abdominal pain, nausea or vomiting.  Assessment & Plan: Principal Problem:   Closed displaced fracture of left femoral neck (HCC) Active Problems:   HYPERTENSION, MILD   Coronary atherosclerosis   Status post placement of cardiac pacemaker   Iron deficiency anemia   Gout   GERD (gastroesophageal reflux disease)   CKD (chronic kidney disease), stage IV (HCC)   Chronic systolic (congestive) heart failure (HCC)   Fall   Hyperkalemia   Protein-calorie malnutrition, severe   Fracture of the left femoral neck -Orthopedic surgery consulted, she was taken to the operating room on 10/29/2018 and is status post left hip hemiarthroplasty by Dr. Marcelino Scot -Postop recovery, PT evaluation pending, suspect she may need SNF  Acute blood loss anemia /underlying iron deficiency anemia -Postop hemoglobin 6.7 from 8.3 prior, transfuse 2 units of packed red blood cells and continue to closely monitor  Hypertension -Hold home Norvasc due to soft blood pressure this morning  Acute on chronic diastolic CHF -With 2+ pitting edema on admission, received IV Lasix -Edema overall better, continue oral Lasix  CAD -continue home medications, no chest pain  Gout -Continue allopurinol   Scheduled Meds: . allopurinol  100 mg Oral Daily  . amLODipine  5 mg Oral Daily  . aspirin  81 mg Oral Daily  .  carvedilol  12.5 mg Oral BID WC  . cholecalciferol  1,000 Units Oral Daily  . escitalopram  10 mg Oral Daily  . feeding supplement (ENSURE ENLIVE)  237 mL Oral TID BM  . fentaNYL  75 mcg Transdermal Q72H  . ferrous sulfate  325 mg Oral Q breakfast  . furosemide  40 mg Oral QHS  . isosorbide mononitrate  60 mg Oral Daily  . Melatonin  6 mg Oral QHS  . multivitamin with minerals  1 tablet Oral Daily  . MUSCLE RUB  1 application Topical BID  . pantoprazole  40 mg Oral Daily  . senna  2 tablet Oral Daily  . tranexamic acid (CYKLOKAPRON) topical -INTRAOP  2,000 mg Topical STAT  . traZODone  100 mg Oral QHS   Continuous Infusions: PRN Meds:.acetaminophen, hydrALAZINE, methocarbamol, morphine injection, ondansetron (ZOFRAN) IV, oxyCODONE-acetaminophen, polyethylene glycol, zolpidem  DVT prophylaxis: per orthopedic surgery  Code Status: DNR Family Communication: No family at bedside this morning Disposition Plan: Likely will need SNF in 2 to 3 days  Consultants:   Orthopedic surgery  Procedures:   None   Antimicrobials:  None    Objective: Vitals:   10/30/18 1057 10/30/18 1128 10/30/18 1146 10/30/18 1307  BP: 106/60 104/60 (!) 97/55 (!) 97/52  Pulse: 73 73 75 72  Resp: 19 20 19 18   Temp: 99 F (37.2 C) 98.4 F (36.9 C) 98.5 F (36.9 C) 99.4 F (37.4 C)  TempSrc: Oral Oral Oral Oral  SpO2: 99% 98% 98% 92%    Intake/Output Summary (Last 24 hours) at 10/30/2018 1440 Last data filed at 10/30/2018 1307 Gross  per 24 hour  Intake 1777 ml  Output 2350 ml  Net -573 ml   There were no vitals filed for this visit.  Examination:  Constitutional: NAD Eyes:  lids and conjunctivae normal ENMT: Mucous membranes are moist.  Neck: normal, supple Respiratory: clear to auscultation bilaterally, no wheezing, no crackles. Normal respiratory effort.  Cardiovascular: Regular rate and rhythm, no murmurs / rubs / gallops. 1+ LE edema. Abdomen: no tenderness. Bowel sounds positive.    Skin: no rashes Neurologic: non focal   Data Reviewed: I have independently reviewed following labs and imaging studies   CBC: Recent Labs  Lab 10/28/18 2049 10/30/18 0516  WBC 4.2 4.6  NEUTROABS 3.3  --   HGB 8.3* 6.7*  HCT 28.1* 22.3*  MCV 93.7 91.4  PLT 178 742*   Basic Metabolic Panel: Recent Labs  Lab 10/28/18 2049 10/30/18 0310  NA 139 139  K 5.2* 4.5  CL 109 106  CO2 21* 24  GLUCOSE 115* 107*  BUN 38* 32*  CREATININE 2.31* 2.05*  CALCIUM 9.2 8.8*   GFR: CrCl cannot be calculated (Unknown ideal weight.). Liver Function Tests: No results for input(s): AST, ALT, ALKPHOS, BILITOT, PROT, ALBUMIN in the last 168 hours. No results for input(s): LIPASE, AMYLASE in the last 168 hours. No results for input(s): AMMONIA in the last 168 hours. Coagulation Profile: Recent Labs  Lab 10/28/18 2049  INR 1.01   Cardiac Enzymes: No results for input(s): CKTOTAL, CKMB, CKMBINDEX, TROPONINI in the last 168 hours. BNP (last 3 results) No results for input(s): PROBNP in the last 8760 hours. HbA1C: No results for input(s): HGBA1C in the last 72 hours. CBG: No results for input(s): GLUCAP in the last 168 hours. Lipid Profile: No results for input(s): CHOL, HDL, LDLCALC, TRIG, CHOLHDL, LDLDIRECT in the last 72 hours. Thyroid Function Tests: No results for input(s): TSH, T4TOTAL, FREET4, T3FREE, THYROIDAB in the last 72 hours. Anemia Panel: No results for input(s): VITAMINB12, FOLATE, FERRITIN, TIBC, IRON, RETICCTPCT in the last 72 hours. Urine analysis:    Component Value Date/Time   COLORURINE YELLOW 01/24/2011 1324   APPEARANCEUR CLEAR 01/24/2011 1324   LABSPEC 1.015 01/24/2011 1324   PHURINE 6.0 01/24/2011 1324   GLUCOSEU NEGATIVE 09/05/2010 1038   HGBUR NEGATIVE 01/24/2011 1324   BILIRUBINUR NEGATIVE 01/24/2011 1324   KETONESUR NEGATIVE 01/24/2011 1324   PROTEINUR NEGATIVE 01/24/2011 1324   UROBILINOGEN 0.2 01/24/2011 1324   NITRITE NEGATIVE 01/24/2011 1324    LEUKOCYTESUR  01/24/2011 1324    NEGATIVE MICROSCOPIC NOT DONE ON URINES WITH NEGATIVE PROTEIN, BLOOD, LEUKOCYTES, NITRITE, OR GLUCOSE <1000 mg/dL.   Sepsis Labs: Invalid input(s): PROCALCITONIN, LACTICIDVEN  Recent Results (from the past 240 hour(s))  Surgical pcr screen     Status: None   Collection Time: 10/29/18  3:26 AM  Result Value Ref Range Status   MRSA, PCR NEGATIVE NEGATIVE Final   Staphylococcus aureus NEGATIVE NEGATIVE Final    Comment: (NOTE) The Xpert SA Assay (FDA approved for NASAL specimens in patients 61 years of age and older), is one component of a comprehensive surveillance program. It is not intended to diagnose infection nor to guide or monitor treatment. Performed at [facility], [place] [address], [place], [place] [address]       Radiology Studies: Dg Chest 1 View  Result Date: 10/28/2018 CLINICAL DATA:  Fall from wheelchair. EXAM: CHEST  1 VIEW COMPARISON:  January 24, 2011 FINDINGS: Continued elevation the right hemidiaphragm. A new pacemaker terminates with leads  in the right atrium and right ventricle. Cardiomegaly. The hila and mediastinum are normal. No pulmonary nodules or masses. IMPRESSION: No active disease. Electronically Signed   By: Dorise Bullion III M.D   On: 10/28/2018 22:52   Dg Hip Unilat With Pelvis 2-3 Views Left  Result Date: 10/29/2018 CLINICAL DATA:  Post left hip restricted 84 year old with hip fracture post hip replacement. EXAM: DG HIP (WITH OR WITHOUT PELVIS) 2-3V LEFT COMPARISON:  10/28/2018. FINDINGS: Lateral view slightly limited by technique. Left total hip replacement appears in satisfactory position without complication noted on frontal view. IMPRESSION: Post total left hip replacement. Electronically Signed   By: Genia Del M.D.   On: 10/29/2018 19:22   Dg Hip Unilat With Pelvis 2-3 Views Left  Result Date: 10/28/2018 CLINICAL DATA:  Fall. EXAM: DG HIP (WITH OR WITHOUT PELVIS) 2-3V LEFT COMPARISON:  None.  FINDINGS: There is a fracture of the proximal femur involving the proximal neck. No dislocation. IMPRESSION: Fracture through the proximal left femoral neck with mild displacement. No dislocation. Electronically Signed   By: Dorise Bullion III M.D   On: 10/28/2018 22:51    Marzetta Board, MD, PhD [facility] Pager [phone]  If 7PM-7AM, please contact night-coverage [email or web address] Password [email or web address] 10/30/2018, 2:40 PM

## 2018-10-30 NOTE — Progress Notes (Signed)
Physical Therapy Evaluation   Clinical Impression: PTA pt resident of [facility] utilizing a w/c for mobility. Pt unwilling to answer further questions about prior function stating"I already told you that". Pt educated on posterior hip precautions prior to mobilization. Pt requiring maxAx2 for coming to seated EOB and was slightly orthostatic (see table below). Pt requires total Ax2 to get to recliner.  Pt on 2 L O2 throughout session with SaO2 >93%O2. PT recommending d/c back to [facility] for rehabilitation. PT will continue to follow acutely.     Orthostatic BPs  Supine 105/58  Sitting 94/60  Sitting after 3 min 106/68       10/30/18 1704  PT Visit Information  Last PT Received On 10/30/18  Assistance Needed +2  Reason for Co-Treatment For patient/therapist safety  PT goals addressed during session Mobility/safety with mobility  OT goals addressed during session ADL's and self-care  History of Present Illness 84 year old female with history of hypertension, CAD, PAD, CHF, bradycardia, chronic kidney disease stage IV, pacemaker who was in her normal state of health at the assisted living facility and fell from wheelchair while trying to transfer. She was found to have left femoral neck fracture, now s/p left hip hemiarthroplasty.   Precautions  Precautions Posterior Hip  Precaution Booklet Issued Yes (comment)  Precaution Comments reviewed 3/3 precautions with Pt  Required Braces or Orthoses Other Brace/Splint  Other Brace/Splint abduction pillow when supine in bed  Restrictions  Weight Bearing Restrictions Yes  LLE Weight Bearing WBAT  Home Living  Family/patient expects to be discharged to: Skilled nursing facility  Additional Comments Pt unreliable historian for typical home set up information  Prior Function  Level of [place] Needs assistance  Gait / Transfers Assistance Needed wheelchair level, Pt reports that she would do her own transfers  ADL's / [place] assist from staff  Comments Pt unreliable historian  Communication  Communication HOH  Pain Assessment  Pain Assessment Faces  Faces Pain Scale 8  Pain Location generalized - focused on L hip  Pain Descriptors / Indicators Moaning;Grimacing;Operative site guarding  Pain Intervention(s) Monitored during session;Repositioned  Cognition  Arousal/Alertness Awake/alert (initially very sleepy, aroused to name )  Behavior During Therapy Flat affect  Overall Cognitive Status No family/caregiver present to determine baseline cognitive functioning  Upper Extremity Assessment  Upper Extremity Assessment Generalized weakness  RUE Deficits / Details baseline contracture  Lower Extremity Assessment  Lower Extremity Assessment LLE deficits/detail  LLE Deficits / Details pain with movement, limited assessment  LLE Unable to fully assess due to pain  LLE Coordination decreased gross motor;decreased fine motor  Cervical / Trunk Assessment  Cervical / Trunk Assessment Kyphotic  Bed Mobility  Overal bed mobility Needs Assistance  Bed Mobility Supine to Sit  Supine to sit Max assist;+2 for physical assistance;+2 for safety/equipment;HOB elevated  General bed mobility comments max A for cues and physical assist  Transfers  Overall transfer level Needs assistance  Equipment used 2 person hand held assist  Transfers Sit to/from Stand;Stand Pivot Transfers  Sit to Stand Max assist;+2 physical assistance;+2 safety/equipment;From elevated surface (gait belt and bed pad to create bucket for boost)  Stand pivot transfers Total assist;+2 physical assistance;+2 safety/equipment  General transfer comment max for lift into standing, total for all pivotal movement - LLE requires blocking out  Ambulation/Gait  General Gait Details unable to attempt  Balance  Overall balance assessment Needs assistance;History of Falls  Sitting-balance support Bilateral upper extremity supported;Feet  supported  Sitting balance-Leahy Scale Poor  Sitting balance - Comments initially max A - able to progress to min guard  Postural control Posterior lean  Standing balance support Bilateral upper extremity supported  Standing balance-Leahy Scale Zero  General Comments  General comments (skin integrity, edema, etc.) no family present during session. At the end of the session, Pt very kind stating "I love you, thank you for helping me"  PT - End of Session  Equipment Utilized During Treatment Gait belt;Oxygen  Activity Tolerance Patient limited by pain  Patient left in chair;with call bell/phone within reach;with chair alarm set  Nurse Communication Weight bearing status;Need for lift equipment;Mobility status  PT Assessment  PT Recommendation/Assessment Patient needs continued PT services  PT Visit Diagnosis Unsteadiness on feet (R26.81);Other abnormalities of gait and mobility (R26.89);Repeated falls (R29.6);Muscle weakness (generalized) (M62.81);History of falling (Z91.81);Difficulty in walking, not elsewhere classified (R26.2);Pain  Pain - Right/Left Left  Pain - part of body Hip  PT Problem List Decreased strength;Decreased range of motion;Decreased activity tolerance;Decreased balance;Decreased mobility;Decreased cognition;Decreased safety awareness;Pain  PT Plan  PT Frequency (ACUTE ONLY) Min 3X/week  PT Treatment/Interventions (ACUTE ONLY) DME instruction;Gait training;Functional mobility training;Therapeutic activities;Therapeutic exercise;Balance training;Cognitive remediation;Patient/family education  AM-PAC PT "6 Clicks" Daily Activity Outcome Measure  Difficulty turning over in bed (including adjusting bedclothes, sheets and blankets)? 1  Difficulty moving from lying on back to sitting on the side of the bed?  1  Difficulty sitting down on and standing up from a chair with arms (e.g., wheelchair, bedside commode, etc,.)? 1  Help needed moving to and from a bed to chair (including  a wheelchair)? 1  Help needed walking in hospital room? 1  Help needed climbing 3-5 steps with a railing?  1  6 Click Score 6  Mobility G Code  CN  PT Recommendation  Follow Up Recommendations SNF  PT equipment Other (comment) (TBD at next venue)  Individuals Consulted  Consulted and Agree with Results and Recommendations Patient  Acute Rehab PT Goals  Patient Stated Goal none stated  PT Goal Formulation With patient  Time For Goal Achievement 11/13/18  Potential to Achieve Goals Fair  PT Time Calculation  PT Start Time (ACUTE ONLY) 1545  PT Stop Time (ACUTE ONLY) 1625  PT Time Calculation (min) (ACUTE ONLY) 40 min  PT General Charges  $$ ACUTE PT VISIT 1 Visit  PT Evaluation  $PT Eval Moderate Complexity 1 Mod

## 2018-10-30 NOTE — Progress Notes (Signed)
Lab just called.  Critical lab value: hemoglobin 6.7.  MD placed orders to transfuse 2 units PRBC.

## 2018-10-30 NOTE — Progress Notes (Signed)
Orthopaedic Trauma Service (OTS)  1 Day Post-Op Procedure(s) (LRB): LEFT HIP HEMIARTHROPLASTY (Left)  Subjective: Patient reports pain as mild.  Alert and responsive.  Objective: Current Vitals Blood pressure 119/62, pulse 80, temperature 98.6 F (37 C), temperature source Oral, resp. rate 20, SpO2 99 %. Vital signs in last 24 hours: Temp:  [97.4 F (36.3 C)-99.3 F (37.4 C)] 98.6 F (37 C) (11/20 0813) Pulse Rate:  [66-80] 80 (11/20 0813) Resp:  [16-28] 20 (11/20 0412) BP: (119-173)/(62-84) 119/62 (11/20 0813) SpO2:  [92 %-100 %] 99 % (11/20 0813)  Intake/Output from previous day: 11/19 0701 - 11/20 0700 In: 650 [I.V.:600; IV Piggyback:50] Out: 0923 [Urine:4200; Blood:350]  LABS Recent Labs    10/28/18 2049 10/30/18 0516  HGB 8.3* 6.7*   Recent Labs    10/28/18 2049 10/30/18 0516  WBC 4.2 4.6  RBC 3.00* 2.44*  HCT 28.1* 22.3*  PLT 178 142*   Recent Labs    10/28/18 2049 10/30/18 0310  NA 139 139  K 5.2* 4.5  CL 109 106  CO2 21* 24  BUN 38* 32*  CREATININE 2.31* 2.05*  GLUCOSE 115* 107*  CALCIUM 9.2 8.8*   Recent Labs    10/28/18 2049  INR 1.01     Physical Exam LLE  Dressing intact, clean, dry  Edema/ swelling controlled  Sens: DPN, SPN, TN intact  Motor: EHL, FHL, and lessor toe ext and flex all intact grossly  Brisk cap refill, warm to touch  Assessment/Plan: 1 Day Post-Op Procedure(s) (LRB): LEFT HIP HEMIARTHROPLASTY (Left) 1. PT/OT WBAT 2. DVT proph low dose Lovenox would be an option  3. Acute blood loss anemia; receiving two units today; will leave foley until tomorrow given CHF and transfusion  Altamese [place], MD [facility] [phone] (p)

## 2018-10-30 NOTE — Progress Notes (Signed)
Pt's daughter, Vaughan Basta, at bedside asking if PT will come to work with pt today. Elizabeth from PT paged and replied that she would be by to see pt after blood transfusion is complete. Linda informed. Will continue to monitor.

## 2018-10-30 NOTE — Progress Notes (Signed)
Occupational Therapy Evaluation Patient Details Name: Gail Vendetti MRN: [id number] DOB: June 26, 1923 Today's Date: 10/30/2018  Clinical Impression: PTA Pt reports that she did her own transfers and got around at wc level. She would not relay how much assist she got for ADL. Pt is overall max A for ADL at this time, total A for all LB ADL, max A +2 to total A +2 for transfers. Pt educated on posterior hip precautions and handout provided - Pt will require SNF care post-acute and continued skilled OT in the acute setting to maximize safety and independence in ADL and functional transfers - especially with focus on posterior hip precautions with focus on ADL.     10/30/18 1600  OT Visit Information  Last OT Received On 10/30/18  Assistance Needed +2  PT/OT/SLP Co-Evaluation/Treatment Yes  Reason for Co-Treatment For patient/therapist safety;To address functional/ADL transfers  PT goals addressed during session Mobility/safety with mobility;Balance  OT goals addressed during session ADL's and self-care  History of Present Illness 84 year old female with history of hypertension, CAD, PAD, CHF, bradycardia, chronic kidney disease stage IV, pacemaker who was in her normal state of health at the assisted living facility and fell from wheelchair while trying to transfer. She was found to have left femoral neck fracture, now s/p left hip hemiarthroplasty.   Precautions  Precautions Posterior Hip  Precaution Booklet Issued Yes (comment)  Precaution Comments reviewed 3/3 precautions with Pt  Required Braces or Orthoses Other Brace/Splint  Other Brace/Splint abduction pillow when supine in bed  Restrictions  Weight Bearing Restrictions Yes  LLE Weight Bearing WBAT  Home Living  Family/patient expects to be discharged to: Skilled nursing facility  Additional Comments Pt unreliable historian for typical home set up information  Prior Function  Level of Independence Needs assistance  Gait /  Transfers Assistance Needed wheelchair level, Pt reports that she would do her own transfers  ADL's / [place] assist from staff  Comments Pt unreliable historian  Communication  Communication HOH  Pain Assessment  Pain Assessment Faces  Faces Pain Scale 8  Pain Location generalized - focused on L hip  Pain Descriptors / Indicators Moaning;Grimacing;Operative site guarding  Pain Intervention(s) Monitored during session;Repositioned  Cognition  Arousal/Alertness Awake/alert (initially very sleepy, aroused to name )  Behavior During Therapy Flat affect  Overall Cognitive Status No family/caregiver present to determine baseline cognitive functioning  Upper Extremity Assessment  Upper Extremity Assessment Generalized weakness;RUE deficits/detail  RUE Deficits / Details baseline contracture  Lower Extremity Assessment  Lower Extremity Assessment LLE deficits/detail  LLE Deficits / Details pain with movement, limited assessment  LLE Unable to fully assess due to pain  LLE Coordination decreased gross motor;decreased fine motor  Cervical / Trunk Assessment  Cervical / Trunk Assessment Kyphotic  ADL  Overall ADL's  Needs assistance/impaired  Eating/Feeding Moderate assistance  Eating/Feeding Details (indicate cue type and reason) staff has been feeding her meals - Pt generally refusing to eat right now from RN report  Grooming Moderate assistance;Sitting  Upper Body Bathing Moderate assistance;Sitting  Lower Body Bathing Total assistance;Sitting/lateral leans  Upper Body Dressing  Moderate assistance;Sitting  Lower Body Dressing Total assistance  Toilet Transfer Maximal assistance;Total assistance;+2 for physical assistance;+2 for safety/equipment;Stand-pivot;BSC  Toilet Transfer Details (indicate cue type and reason) face to face transfer, max A +2 with bed pad used as bucket in conjunction with gait belt, max A to stand, total to pivot  Toileting- Clothing  Manipulation and Hygiene Total assistance  General ADL Comments will  need continued education for posterior hip precautions with focus on ADL  Bed Mobility  Overal bed mobility Needs Assistance  Bed Mobility Supine to Sit  Supine to sit Max assist;+2 for physical assistance;+2 for safety/equipment;HOB elevated  General bed mobility comments max A for cues and physical assist  Transfers  Overall transfer level Needs assistance  Equipment used 2 person hand held assist  Transfers Sit to/from Stand;Stand Pivot Transfers  Sit to Stand Max assist;+2 physical assistance;+2 safety/equipment;From elevated surface (gait belt and bed pad to create bucket for boost)  Stand pivot transfers Total assist;+2 physical assistance;+2 safety/equipment  General transfer comment max for lift into standing, total for all pivotal movement - LLE requires blocking out  Balance  Overall balance assessment Needs assistance;History of Falls  Sitting-balance support Bilateral upper extremity supported;Feet supported  Sitting balance-Leahy Scale Poor  Sitting balance - Comments initially max A - able to progress to min guard  Postural control Posterior lean  Standing balance support Bilateral upper extremity supported  Standing balance-Leahy Scale Zero  General Comments  General comments (skin integrity, edema, etc.) no family present during session. At the end of the session, Pt very kind stating "I love you, thank you for helping me"  OT - End of Session  Equipment Utilized During Treatment Gait belt;Oxygen (1 L)  Activity Tolerance Patient tolerated treatment well  Patient left in chair;with call bell/phone within reach;with chair alarm set  Nurse Communication Mobility status;Precautions;Weight bearing status  OT Assessment  OT Recommendation/Assessment Patient needs continued OT Services  OT Visit Diagnosis Unsteadiness on feet (R26.81);Other abnormalities of gait and mobility (R26.89);Muscle weakness  (generalized) (M62.81);History of falling (Z91.81);Other symptoms and signs involving cognitive function;Pain  Pain - Right/Left Left  Pain - part of body Hip  OT Problem List Decreased strength;Decreased range of motion;Decreased activity tolerance;Impaired balance (sitting and/or standing);Decreased safety awareness;Decreased knowledge of precautions;Pain  OT Plan  OT Frequency (ACUTE ONLY) Min 2X/week  OT Treatment/Interventions (ACUTE ONLY) Self-care/ADL training;DME and/or AE instruction;Therapeutic activities;Patient/family education;Balance training  AM-PAC OT "6 Clicks" Daily Activity Outcome Measure  Help from another person eating meals? 2  Help from another person taking care of personal grooming? 2  Help from another person toileting, which includes using toliet, bedpan, or urinal? 1  Help from another person bathing (including washing, rinsing, drying)? 2  Help from another person to put on and taking off regular upper body clothing? 2  Help from another person to put on and taking off regular lower body clothing? 1  6 Click Score 10  ADL G Code Conversion CL  OT Recommendation  Follow Up Recommendations SNF;Supervision/Assistance - 24 hour  OT Equipment Other (comment) (defer to next venue of care)  Individuals Consulted  Consulted and Agree with Results and Recommendations Patient  OT Time Calculation  OT Start Time (ACUTE ONLY) 1558  OT Stop Time (ACUTE ONLY) 1630  OT Time Calculation (min) 32 min  OT General Charges  $OT Visit 1 Visit  OT Evaluation  $OT Eval Moderate Complexity [address] [place] OTR/L Acute Rehabilitation Services Pager: [phone] Office: [phone]

## 2018-10-30 NOTE — Progress Notes (Signed)
This RN spoke to pt's daughter, Vaughan Basta, to inform that PT saw pt and pt is in chair. Will continue to monitor.

## 2018-10-30 NOTE — Progress Notes (Signed)
Blood bank called; 2 units of PRBC's are ready.  Will have day shift nurse to follow up.

## 2018-10-30 NOTE — Progress Notes (Signed)
PT Cancellation Note  Patient Details Name: Dejanay Wamboldt MRN: [id number] DOB: 07/02/1923   Cancelled Treatment:    Reason Eval/Treat Not Completed: (P) Medical issues which prohibited therapy. Hg 6.7, pt to be transfused, PT will follow back when Hg in therapeutic levels.   Vernell Morgans, SPT Acute Rehabilitation Services Office [phone]   Vernell Morgans 10/30/2018, 8:36 AM

## 2018-10-31 ENCOUNTER — Encounter (HOSPITAL_COMMUNITY): Payer: Self-pay

## 2018-10-31 LAB — BASIC METABOLIC PANEL
ANION GAP: 7 (ref 5–15)
BUN: 46 mg/dL — ABNORMAL HIGH (ref 8–23)
CALCIUM: 7.9 mg/dL — AB (ref 8.9–10.3)
CHLORIDE: 107 mmol/L (ref 98–111)
CO2: 24 mmol/L (ref 22–32)
Creatinine, Ser: 2.8 mg/dL — ABNORMAL HIGH (ref 0.44–1.00)
GFR calc non Af Amer: 14 mL/min — ABNORMAL LOW (ref >60)
GFR, EST AFRICAN AMERICAN: 16 mL/min — AB (ref >60)
GLUCOSE: 117 mg/dL — AB (ref 70–99)
POTASSIUM: 4.8 mmol/L (ref 3.5–5.1)
Sodium: 138 mmol/L (ref 135–145)

## 2018-10-31 LAB — PREPARE RBC (CROSSMATCH)

## 2018-10-31 LAB — CBC
HEMATOCRIT: 23 % — AB (ref 36.0–46.0)
Hemoglobin: 7.5 g/dL — ABNORMAL LOW (ref 12.0–15.0)
MCH: 29.1 pg (ref 26.0–34.0)
MCHC: 32.6 g/dL (ref 30.0–36.0)
MCV: 89.1 fL (ref 80.0–100.0)
NRBC: 0 % (ref 0.0–0.2)
Platelets: 123 10*3/uL — ABNORMAL LOW (ref 150–400)
RBC: 2.58 MIL/uL — ABNORMAL LOW (ref 3.87–5.11)
RDW: 15.2 % (ref 11.5–15.5)
WBC: 6 10*3/uL (ref 4.0–10.5)

## 2018-10-31 MED ORDER — SODIUM CHLORIDE 0.9% IV SOLUTION
Freq: Once | INTRAVENOUS | Status: AC
  Administered 2018-10-31: 11:00:00 via INTRAVENOUS

## 2018-10-31 MED ORDER — TRAMADOL HCL 50 MG PO TABS
50.0000 mg | ORAL_TABLET | Freq: Four times a day (QID) | ORAL | Status: DC | PRN
  Administered 2018-10-31 – 2018-11-05 (×6): 50 mg via ORAL
  Filled 2018-10-31 (×6): qty 1

## 2018-10-31 MED ORDER — FUROSEMIDE 10 MG/ML IJ SOLN
40.0000 mg | Freq: Every day | INTRAMUSCULAR | Status: DC
  Filled 2018-10-31: qty 4

## 2018-10-31 NOTE — Progress Notes (Signed)
[facility]edic [facility] (OTS)  2 Days Post-Op Procedure(s) (LRB): LEFT HIP HEMIARTHROPLASTY (Left)  Subjective: Patient reports pain as mild. Did not get out of bed today per sister. Sedated with one percocet.  Objective: Current Vitals Blood pressure 127/60, pulse 71, temperature 98.6 F (37 C), temperature source Oral, resp. rate 15, height 5\' 3"  (1.6 m), weight 68.2 kg, SpO2 94 %. Vital signs in last 24 hours: Temp:  [98.6 F (37 C)-99 F (37.2 C)] 98.6 F (37 C) (11/21 1702) Pulse Rate:  [66-77] 71 (11/21 1702) Resp:  [14-16] 15 (11/21 1702) BP: (99-138)/(47-65) 127/60 (11/21 1702) SpO2:  [94 %-97 %] 94 % (11/21 1702) Weight:  [68.2 kg] 68.2 kg (11/21 1238)  Intake/Output from previous day: 11/20 0701 - 11/21 0700 In: 5498 [P.O.:837; I.V.:20; Blood:630] Out: 300 [Urine:300]  LABS Recent Labs    10/28/18 2049 10/30/18 0516 10/30/18 1355 10/31/18 0231  HGB 8.3* 6.7* 7.9* 7.5*   Recent Labs    10/30/18 0516 10/30/18 1355 10/31/18 0231  WBC 4.6  --  6.0  RBC 2.44*  --  2.58*  HCT 22.3* 25.4* 23.0*  PLT 142*  --  123*   Recent Labs    10/30/18 0310 10/31/18 0231  NA 139 138  K 4.5 4.8  CL 106 107  CO2 24 24  BUN 32* 46*  CREATININE 2.05* 2.80*  GLUCOSE 107* 117*  CALCIUM 8.8* 7.9*   Recent Labs    10/28/18 2049  INR 1.01     Physical Exam Somnolent but easily arousable and answers questions immediately LLE       Dressing intact, clean, dry, one spot of drainage             Edema/ swelling controlled             Sens: DPN, SPN, TN intact             Motor: EHL, FHL, and lessor toe ext and flex all intact grossly             Brisk cap refill, warm to touch  Assessment/Plan: 2 Days Post-Op Procedure(s) (LRB): LEFT HIP HEMIARTHROPLASTY (Left) 1. PT/OT WBAT, post hip precautions 2. DVT proph ASA 3. Another transfusion today. 4. May try/ change to Ultram to see if less sedating  Altamese Perry, MD [facility],  PC [phone] (p)

## 2018-10-31 NOTE — Progress Notes (Signed)
PROGRESS NOTE  Laura Houston ZOX:[id number] DOB: 01-17-1923 DOA: 10/28/2018 PCP: Patient, No Pcp Per   LOS: 2 days   Brief Narrative / Interim history: 84 year old female with history of hypertension, CAD, PAD, CHF, bradycardia, chronic kidney disease stage IV, pacemaker who was in her normal state of health at the assisted living facility and fell from wheelchair while trying to transfer.  She was found to have left femoral neck fracture, orthopedic surgery was consulted and we admitted the patient  Subjective: -Complains of hip pain, denies any chest pain, denies any shortness of breath.  Appears quite weak.  Assessment & Plan: Principal Problem:   Closed displaced fracture of left femoral neck (HCC) Active Problems:   HYPERTENSION, MILD   Coronary atherosclerosis   Status post placement of cardiac pacemaker   Iron deficiency anemia   Gout   GERD (gastroesophageal reflux disease)   CKD (chronic kidney disease), stage IV (HCC)   Chronic systolic (congestive) heart failure (HCC)   Fall   Hyperkalemia   Protein-calorie malnutrition, severe   Fracture of the left femoral neck -Orthopedic surgery consulted, she was taken to the operating room on 10/29/2018 and is status post left hip hemiarthroplasty by Dr. Marcelino Scot -PT evaluation pending, received blood transfusion yesterday -Suspect she will need SNF  Acute blood loss anemia /underlying iron deficiency anemia -Postop hemoglobin 6.7 from 8.3 prior, transfused 2 units of packed red blood cells on 11/20, hemoglobin drifting down again into the low 7 range this morning we will transfuse 1 additional packed red blood.  Will follow with IV Lasix  Hypertension -Hold home Norvasc due to soft blood pressure this morning  Acute kidney injury on chronic kidney disease stage IV -Baseline creatinine earlier this year 2.2, most recent creatinine values around 1.5 back in 2012 so unclear creatinine in the last 7 years.  Creatinine with  bumped to 2.8 this morning from 2.0 yesterday, her acute kidney injury is likely multifactorial postop versus anemia.  I will transfuse her a 3rd unit of packed red blood cells today and will follow this with Lasix to prevent fluid overload -Closely monitor renal function, Lasix x1 given 3 units administered, hold further Lasix  Acute on chronic diastolic CHF -With 2+ pitting edema on admission, received IV Lasi, -Edema overall better, transfuse followed by Lasix x1 as above.  Hold further Lasix  CAD -continue home medications, no chest pain  Gout -Continue allopurinol   Scheduled Meds: . sodium chloride   Intravenous Once  . allopurinol  100 mg Oral Daily  . aspirin  81 mg Oral Daily  . carvedilol  12.5 mg Oral BID WC  . cholecalciferol  1,000 Units Oral Daily  . escitalopram  10 mg Oral Daily  . feeding supplement (ENSURE ENLIVE)  237 mL Oral TID BM  . fentaNYL  75 mcg Transdermal Q72H  . ferrous sulfate  325 mg Oral Q breakfast  . furosemide  40 mg Intravenous Daily  . isosorbide mononitrate  60 mg Oral Daily  . Melatonin  6 mg Oral QHS  . multivitamin with minerals  1 tablet Oral Daily  . MUSCLE RUB  1 application Topical BID  . pantoprazole  40 mg Oral Daily  . senna  2 tablet Oral Daily  . traZODone  100 mg Oral QHS   Continuous Infusions: PRN Meds:.acetaminophen, hydrALAZINE, methocarbamol, morphine injection, ondansetron (ZOFRAN) IV, oxyCODONE-acetaminophen, polyethylene glycol, zolpidem  DVT prophylaxis: per orthopedic surgery  Code Status: DNR Family Communication: No family at bedside this morning  Disposition Plan: Likely will need SNF in 2 to 3 days  Consultants:   Orthopedic surgery  Procedures:   None   Antimicrobials:  None    Objective: Vitals:   10/30/18 1933 10/31/18 0535 10/31/18 0815 10/31/18 0840  BP: (!) 100/52 124/61  (!) 99/49  Pulse: 73 75  70  Resp: 16 14  16   Temp: 98.7 F (37.1 C) 98.9 F (37.2 C)  99 F (37.2 C)  TempSrc:  Oral Axillary  Oral  SpO2: 96% 95% 97% 97%    Intake/Output Summary (Last 24 hours) at 10/31/2018 1034 Last data filed at 10/31/2018 0932 Gross per 24 hour  Intake 1240 ml  Output 300 ml  Net 940 ml   There were no vitals filed for this visit.  Examination:  Constitutional: No distress Eyes: No scleral icterus ENMT: Moist mucous membranes Neck: Normal, supple Respiratory: Clear bilaterally without wheeze no crackles, shallow breathing Cardiovascular: Regular rate and rhythm, 3/6 SEM, 1+ pitting lower extremity edema Abdomen: Soft, NT, ND, bowel sounds positive Skin: No rashes seen Neurologic: No focal findings   Data Reviewed: I have independently reviewed following labs and imaging studies   CBC: Recent Labs  Lab 10/28/18 2049 10/30/18 0516 10/30/18 1355 10/31/18 0231  WBC 4.2 4.6  --  6.0  NEUTROABS 3.3  --   --   --   HGB 8.3* 6.7* 7.9* 7.5*  HCT 28.1* 22.3* 25.4* 23.0*  MCV 93.7 91.4  --  89.1  PLT 178 142*  --  355*   Basic Metabolic Panel: Recent Labs  Lab 10/28/18 2049 10/30/18 0310 10/31/18 0231  NA 139 139 138  K 5.2* 4.5 4.8  CL 109 106 107  CO2 21* 24 24  GLUCOSE 115* 107* 117*  BUN 38* 32* 46*  CREATININE 2.31* 2.05* 2.80*  CALCIUM 9.2 8.8* 7.9*   GFR: CrCl cannot be calculated (Unknown ideal weight.). Liver Function Tests: No results for input(s): AST, ALT, ALKPHOS, BILITOT, PROT, ALBUMIN in the last 168 hours. No results for input(s): LIPASE, AMYLASE in the last 168 hours. No results for input(s): AMMONIA in the last 168 hours. Coagulation Profile: Recent Labs  Lab 10/28/18 2049  INR 1.01   Cardiac Enzymes: No results for input(s): CKTOTAL, CKMB, CKMBINDEX, TROPONINI in the last 168 hours. BNP (last 3 results) No results for input(s): PROBNP in the last 8760 hours. HbA1C: No results for input(s): HGBA1C in the last 72 hours. CBG: No results for input(s): GLUCAP in the last 168 hours. Lipid Profile: No results for input(s):  CHOL, HDL, LDLCALC, TRIG, CHOLHDL, LDLDIRECT in the last 72 hours. Thyroid Function Tests: No results for input(s): TSH, T4TOTAL, FREET4, T3FREE, THYROIDAB in the last 72 hours. Anemia Panel: No results for input(s): VITAMINB12, FOLATE, FERRITIN, TIBC, IRON, RETICCTPCT in the last 72 hours. Urine analysis:    Component Value Date/Time   COLORURINE YELLOW 01/24/2011 1324   APPEARANCEUR CLEAR 01/24/2011 1324   LABSPEC 1.015 01/24/2011 1324   PHURINE 6.0 01/24/2011 1324   GLUCOSEU NEGATIVE 09/05/2010 1038   HGBUR NEGATIVE 01/24/2011 1324   BILIRUBINUR NEGATIVE 01/24/2011 1324   KETONESUR NEGATIVE 01/24/2011 1324   PROTEINUR NEGATIVE 01/24/2011 1324   UROBILINOGEN 0.2 01/24/2011 1324   NITRITE NEGATIVE 01/24/2011 1324   LEUKOCYTESUR  01/24/2011 1324    NEGATIVE MICROSCOPIC NOT DONE ON URINES WITH NEGATIVE PROTEIN, BLOOD, LEUKOCYTES, NITRITE, OR GLUCOSE <1000 mg/dL.   Sepsis Labs: Invalid input(s): PROCALCITONIN, LACTICIDVEN  Recent Results (from the past 240 hour(s))  Surgical pcr  screen     Status: None   Collection Time: 10/29/18  3:26 AM  Result Value Ref Range Status   MRSA, PCR NEGATIVE NEGATIVE Final   Staphylococcus aureus NEGATIVE NEGATIVE Final    Comment: (NOTE) The Xpert SA Assay (FDA approved for NASAL specimens in patients 50 years of age and older), is one component of a comprehensive surveillance program. It is not intended to diagnose infection nor to guide or monitor treatment. Performed at [facility], [place] [address] [place], [place] [address]       Radiology Studies: Dg Hip Unilat With Pelvis 2-3 Views Left  Result Date: 10/29/2018 CLINICAL DATA:  Post left hip restricted 84 year old with hip fracture post hip replacement. EXAM: DG HIP (WITH OR WITHOUT PELVIS) 2-3V LEFT COMPARISON:  10/28/2018. FINDINGS: Lateral view slightly limited by technique. Left total hip replacement appears in satisfactory position without complication noted on frontal  view. IMPRESSION: Post total left hip replacement. Electronically Signed   By: Genia Del M.D.   On: 10/29/2018 19:22    Marzetta Board, MD, PhD [facility] Pager [phone]  If 7PM-7AM, please contact night-coverage [email or web address] Password [email or web address] 10/31/2018, 10:34 AM

## 2018-10-31 NOTE — Plan of Care (Signed)
  Problem: Education: Goal: Verbalization of understanding the information provided (i.e., activity precautions, restrictions, etc) will improve Outcome: Progressing   Problem: Activity: Goal: Ability to ambulate and perform ADLs will improve Outcome: Progressing   Problem: Clinical Measurements: Goal: Postoperative complications will be avoided or minimized Outcome: Progressing   Problem: Pain Management: Goal: Pain level will decrease Outcome: Progressing   Problem: Health Behavior/Discharge Planning: Goal: Ability to manage health-related needs will improve Outcome: Progressing   Problem: Clinical Measurements: Goal: Will remain free from infection Outcome: Progressing   Problem: Activity: Goal: Risk for activity intolerance will decrease Outcome: Progressing   Problem: Nutrition: Goal: Adequate nutrition will be maintained Outcome: Progressing   Problem: Pain Managment: Goal: General experience of comfort will improve Outcome: Progressing   Problem: Safety: Goal: Ability to remain free from injury will improve Outcome: Progressing

## 2018-10-31 NOTE — Progress Notes (Signed)
Pt Has been very sleepy today after Percocet 1 tab was given. 1 Unit PRBCs given and completed, no transfusion reaction was noted.

## 2018-11-01 ENCOUNTER — Encounter (HOSPITAL_COMMUNITY): Payer: Self-pay

## 2018-11-01 LAB — BPAM RBC
BLOOD PRODUCT EXPIRATION DATE: 201911282359
Blood Product Expiration Date: 201911272359
Blood Product Expiration Date: 201911292359
ISSUE DATE / TIME: 201911200856
ISSUE DATE / TIME: 201911211048
ISSUE DATE / TIME: 201911201123
UNIT TYPE AND RH: 7300
Unit Type and Rh: 7300
Unit Type and Rh: 7300

## 2018-11-01 LAB — BASIC METABOLIC PANEL
Anion gap: 5 (ref 5–15)
BUN: 53 mg/dL — ABNORMAL HIGH (ref 8–23)
CO2: 25 mmol/L (ref 22–32)
Calcium: 7.9 mg/dL — ABNORMAL LOW (ref 8.9–10.3)
Chloride: 109 mmol/L (ref 98–111)
Creatinine, Ser: 2.91 mg/dL — ABNORMAL HIGH (ref 0.44–1.00)
GFR calc Af Amer: 15 mL/min — ABNORMAL LOW (ref >60)
GFR, EST NON AFRICAN AMERICAN: 13 mL/min — AB (ref >60)
Glucose, Bld: 86 mg/dL (ref 70–99)
POTASSIUM: 4.6 mmol/L (ref 3.5–5.1)
Sodium: 139 mmol/L (ref 135–145)

## 2018-11-01 LAB — CBC
HCT: 24.3 % — ABNORMAL LOW (ref 36.0–46.0)
HEMOGLOBIN: 7.6 g/dL — AB (ref 12.0–15.0)
MCH: 28.4 pg (ref 26.0–34.0)
MCHC: 31.3 g/dL (ref 30.0–36.0)
MCV: 90.7 fL (ref 80.0–100.0)
NRBC: 0 % (ref 0.0–0.2)
Platelets: 109 10*3/uL — ABNORMAL LOW (ref 150–400)
RBC: 2.68 MIL/uL — AB (ref 3.87–5.11)
RDW: 15.3 % (ref 11.5–15.5)
WBC: 5.5 10*3/uL (ref 4.0–10.5)

## 2018-11-01 LAB — TYPE AND SCREEN
ABO/RH(D): B POS
ANTIBODY SCREEN: NEGATIVE
UNIT DIVISION: 0
Unit division: 0
Unit division: 0

## 2018-11-01 MED ORDER — CARVEDILOL 6.25 MG PO TABS
6.2500 mg | ORAL_TABLET | Freq: Two times a day (BID) | ORAL | Status: DC
  Administered 2018-11-03 – 2018-11-05 (×5): 6.25 mg via ORAL
  Filled 2018-11-01 (×7): qty 1

## 2018-11-01 NOTE — NC FL2 (Signed)
[place] LEVEL OF CARE SCREENING TOOL     IDENTIFICATION  Patient Name: Laura Houston Birthdate: 1923-09-27 Sex: female Admission Date (Current Location): 10/28/2018  [facility] and [place] Number:  Herbalist and Address:  [place]. [facility], [place] [address] [place], [place] [address]      Provider Number: [id number]  Attending Physician Name and Address:  Alma Friendly, MD  Relative Name and Phone Number:  Vaughan Basta [phone]    Current Level of Care: Hospital Recommended Level of Care: [place] Prior Approval Number:    Date Approved/Denied:   PASRR Number: [id number]  Discharge Plan: SNF    Current Diagnoses: Patient Active Problem List   Diagnosis Date Noted  . Fall 10/29/2018  . Hyperkalemia 10/29/2018  . Closed displaced fracture of left femoral neck ([place]) 10/29/2018  . Protein-calorie malnutrition, severe 10/29/2018  . Status post placement of cardiac pacemaker   . Iron deficiency anemia   . Hospice care   . Gout   . GERD (gastroesophageal reflux disease)   . CKD (chronic kidney disease), stage IV ([place])   . Chronic systolic (congestive) heart failure ([place])   . CAD (coronary artery disease)   . Bradycardia   . HYPERTENSION, MILD 08/26/2010  . BRADYCARDIA 08/26/2010  . EDEMA 08/26/2010  . Coronary atherosclerosis 08/24/2010    Orientation RESPIRATION BLADDER Height & Weight     Self, Place  O2(Nasal Cannula 2L/min) External catheter, Incontinent Weight: 150 lb 6.4 oz (68.2 kg) Height:  5\' 3"  (160 cm)  BEHAVIORAL SYMPTOMS/MOOD NEUROLOGICAL BOWEL NUTRITION STATUS      Continent Diet(see dischareg summary)  AMBULATORY STATUS COMMUNICATION OF NEEDS Skin   Extensive Assist Verbally Surgical wounds(closed incision left hip)                       Personal Care Assistance Level of Assistance  Bathing, Feeding, Dressing, Total care Bathing Assistance: Maximum assistance Feeding  assistance: Independent Dressing Assistance: Maximum assistance Total Care Assistance: Maximum assistance   Functional Limitations Info  Sight, Hearing, Speech Sight Info: Adequate Hearing Info: Adequate Speech Info: Adequate    SPECIAL CARE FACTORS FREQUENCY  PT (By licensed PT), OT (By licensed OT)     PT Frequency: min 5x weekly OT Frequency: min 3x weekly            Contractures Contractures Info: Not present    Additional Factors Info  Code Status, Allergies Code Status Info: DNR Allergies Info: Allergies:  Doxycycline, Penicillins, Sulfonamide Derivatives           Current Medications (11/01/2018):  This is the current hospital active medication list Current Facility-Administered Medications  Medication Dose Route Frequency Provider Last Rate Last Dose  . acetaminophen (TYLENOL) tablet 500 mg  500 mg Oral Q6H PRN Ainsley Spinner, PA-C      . allopurinol (ZYLOPRIM) tablet 100 mg  100 mg Oral Daily Ainsley Spinner, PA-C   100 mg at 11/01/18 8469  . aspirin chewable tablet 81 mg  81 mg Oral Daily Ainsley Spinner, PA-C   81 mg at 11/01/18 6295  . carvedilol (COREG) tablet 12.5 mg  12.5 mg Oral BID WC Ainsley Spinner, PA-C   12.5 mg at 10/31/18 1717  . cholecalciferol (VITAMIN D3) tablet 1,000 Units  1,000 Units Oral Daily Ainsley Spinner, PA-C   1,000 Units at 11/01/18 2841  . escitalopram (LEXAPRO) tablet 10 mg  10 mg Oral Daily Ainsley Spinner, PA-C   10  mg at 11/01/18 0823  . feeding supplement (ENSURE ENLIVE) (ENSURE ENLIVE) liquid 237 mL  237 mL Oral TID BM Ainsley Spinner, PA-C   237 mL at 11/01/18 0824  . ferrous sulfate tablet 325 mg  325 mg Oral Q breakfast Ainsley Spinner, PA-C   325 mg at 11/01/18 0109  . hydrALAZINE (APRESOLINE) injection 5 mg  5 mg Intravenous Q2H PRN Ainsley Spinner, PA-C      . isosorbide mononitrate (IMDUR) 24 hr tablet 60 mg  60 mg Oral Daily Ainsley Spinner, PA-C   60 mg at 11/01/18 3235  . Melatonin TABS 6 mg  6 mg Oral QHS Ainsley Spinner, PA-C   6 mg at 10/31/18 2038  .  methocarbamol (ROBAXIN) tablet 500 mg  500 mg Oral Q8H PRN Ainsley Spinner, PA-C   500 mg at 10/31/18 2038  . multivitamin with minerals tablet 1 tablet  1 tablet Oral Daily Ainsley Spinner, PA-C   1 tablet at 11/01/18 5732  . MUSCLE RUB CREA 1 application  1 application Topical BID Ainsley Spinner, PA-C   1 application at 20/25/42 7062  . ondansetron (ZOFRAN) injection 4 mg  4 mg Intravenous Q8H PRN Ainsley Spinner, PA-C   4 mg at 10/29/18 1634  . oxyCODONE-acetaminophen (PERCOCET/ROXICET) 5-325 MG per tablet 1 tablet  1 tablet Oral Q4H PRN Ainsley Spinner, PA-C   1 tablet at 10/31/18 0845  . pantoprazole (PROTONIX) EC tablet 40 mg  40 mg Oral Daily Ainsley Spinner, PA-C   40 mg at 11/01/18 3762  . polyethylene glycol (MIRALAX / GLYCOLAX) packet 17 g  17 g Oral Daily PRN Ainsley Spinner, PA-C      . senna (SENOKOT) tablet 17.2 mg  2 tablet Oral Daily Ainsley Spinner, PA-C   17.2 mg at 11/01/18 0824  . traMADol (ULTRAM) tablet 50 mg  50 mg Oral Q6H PRN Altamese [place], MD   50 mg at 10/31/18 2038  . traZODone (DESYREL) tablet 100 mg  100 mg Oral QHS Ainsley Spinner, PA-C   100 mg at 10/31/18 2038  . zolpidem (AMBIEN) tablet 5 mg  5 mg Oral QHS PRN Ainsley Spinner, PA-C         Discharge Medications: Please see discharge summary for a list of discharge medications.  Relevant Imaging Results:  Relevant Lab Results:   Additional Information SSN: [id number]  Alberteen Sam, LCSW

## 2018-11-01 NOTE — Progress Notes (Signed)
Physical Therapy Treatment Patient Details Name: Laura Houston MRN: [id number] DOB: 07/18/1923 Today's Date: 11/01/2018    History of Present Illness 84 year old female with history of hypertension, CAD, PAD, CHF, bradycardia, chronic kidney disease stage IV, pacemaker who was in her normal state of health at the assisted living facility and fell from wheelchair while trying to transfer. She was found to have left femoral neck fracture, now s/p left hip hemiarthroplasty.     PT Comments    Pt much more alert today with therapy. Pt agreeable to getting up but stated "please don't hurt me.". Pt limited in safe mobility by increased pain with movement as well as decreased strength and balance and slowed initiation of movement. Pt requires modAx2 for bed mobility and transfers today. Pt reeducated on posterior hip precautions with decreased carryover. D/c plans remain appropriate at this time. PT will continue to follow.     Follow Up Recommendations  SNF     Equipment Recommendations  Other (comment)(TBD at next venue)       Precautions / Restrictions Precautions Precautions: Posterior Hip Precaution Booklet Issued: Yes (comment) Precaution Comments: reviewed 3/3 precautions with Pt Required Braces or Orthoses: Other Brace/Splint Other Brace/Splint: pt found in bed with abduction pillow in place, but hip was internally rotated, pt reeducated on precautions and was able to correct hip rotation to neutral with grimace on face Restrictions Weight Bearing Restrictions: Yes LLE Weight Bearing: Weight bearing as tolerated    Mobility  Bed Mobility Overal bed mobility: Needs Assistance Bed Mobility: Supine to Sit     Supine to sit: +2 for physical assistance;HOB elevated;Mod assist     General bed mobility comments: modAx2 for truncal support and for management of LE off bed, as well as pad scoot of hips to EoB. vc for reach across body to improve trunk rotation    Transfers Overall transfer level: Needs assistance Equipment used: 2 person hand held assist Transfers: Sit to/from Stand;Stand Pivot Transfers Sit to Stand: +2 physical assistance;From elevated surface;Mod assist(gait belt and bed pad to create bucket for boost) Stand pivot transfers: Mod assist;+2 physical assistance       General transfer comment: modAx2 for powerup and steadying in standing, vc for stepping transfer to recliner, increased effort needed to move L LE  Ambulation/Gait             General Gait Details: unable to attempt       Balance Overall balance assessment: Needs assistance;History of Falls Sitting-balance support: Bilateral upper extremity supported;Feet supported Sitting balance-Leahy Scale: Poor Sitting balance - Comments: initially max A - able to progress to min guard Postural control: Posterior lean Standing balance support: Bilateral upper extremity supported Standing balance-Leahy Scale: Zero                              Cognition Arousal/Alertness: Awake/alert(initially very sleepy, aroused to name ) Behavior During Therapy: WFL for tasks assessed/performed Overall Cognitive Status: History of cognitive impairments - at baseline                                 General Comments: family present and report baseline         General Comments General comments (skin integrity, edema, etc.): Family present and appreciative of assistance to transfer to recliner. Pt on 2L O2 via Portal, SaO2 deopped to 88%O2 with movement but quickly  returned to 95%O2. PT readjusted [place] as it was leaving increased pressure on cheeks leaving indentations.       Pertinent Vitals/Pain Pain Assessment: Faces Faces Pain Scale: Hurts little more Pain Location: generalized - focused on L hip Pain Descriptors / Indicators: Moaning;Grimacing;Operative site guarding Pain Intervention(s): Limited activity within patient's tolerance;Monitored during  session;Repositioned           PT Goals (current goals can now be found in the care plan section) Acute Rehab PT Goals Patient Stated Goal: none stated PT Goal Formulation: With patient Time For Goal Achievement: 11/13/18 Potential to Achieve Goals: Fair Progress towards PT goals: Progressing toward goals    Frequency    Min 3X/week      PT Plan Current plan remains appropriate    Co-evaluation              AM-PAC PT "6 Clicks" Daily Activity  Outcome Measure  Difficulty turning over in bed (including adjusting bedclothes, sheets and blankets)?: Unable Difficulty moving from lying on back to sitting on the side of the bed? : Unable Difficulty sitting down on and standing up from a chair with arms (e.g., wheelchair, bedside commode, etc,.)?: Unable Help needed moving to and from a bed to chair (including a wheelchair)?: Total Help needed walking in hospital room?: Total Help needed climbing 3-5 steps with a railing? : Total 6 Click Score: 6    End of Session Equipment Utilized During Treatment: Gait belt;Oxygen Activity Tolerance: Patient limited by pain Patient left: in chair;with call bell/phone within reach;with chair alarm set Nurse Communication: Weight bearing status;Need for lift equipment;Mobility status PT Visit Diagnosis: Unsteadiness on feet (R26.81);Other abnormalities of gait and mobility (R26.89);Repeated falls (R29.6);Muscle weakness (generalized) (M62.81);History of falling (Z91.81);Difficulty in walking, not elsewhere classified (R26.2);Pain Pain - Right/Left: Left Pain - part of body: Hip     Time: 1135-1202 PT Time Calculation (min) (ACUTE ONLY): 27 min  Charges:  $Therapeutic Activity: 23-37 mins                     Nissi Doffing B. Migdalia Dk PT, DPT Acute Rehabilitation Services Pager [phone] Office [phone]    [place] 11/01/2018, 12:52 PM

## 2018-11-01 NOTE — Care Management Important Message (Signed)
Important Message  Patient Details  Name: Laura Houston MRN: [id number] Date of Birth: 24-Oct-1923   Medicare Important Message Given:  Yes    Skii Cleland Montine Circle 11/01/2018, 3:54 PM

## 2018-11-01 NOTE — Clinical Social Work Note (Signed)
Clinical Social Work Assessment  Patient Details  Name: Laura Houston MRN: [id number] Date of Birth: [birth year]  Date of referral:  11/01/18               Reason for consult:  Discharge Planning                Permission sought to share information with:  Case Manager, Facility Sport and exercise psychologist, Family Supports Permission granted to share information::  Yes, Verbal Permission Granted  Name::     Vaughan Basta  Agency::  SNFs  Relationship::  daughter  Contact Information:  [phone]  Housing/Transportation Living arrangements for the past 2 months:  [place] of Information:  Patient, Partner Patient Interpreter Needed:    Criminal Activity/Legal Involvement Pertinent to Current Situation/Hospitalization:  No - Comment as needed Significant Relationships:  Adult Children Lives with:  Facility Resident Do you feel safe going back to the place where you live?  Yes Need for family participation in patient care:  Yes (Comment)  Care giving concerns:  CSW received referral for possible SNF placement at time of discharge. Spoke with patient's daughter who makes decisions for patient  regarding possibility of SNF placement . Patient's family is currently unable to care for her at their home given patient's current needs and fall risk.  Patient and her daughter Vaughan Basta expressed understanding of PT recommendation and are agreeable to SNF placement at time of discharge. CSW to continue to follow and assist with discharge planning needs.    Social Worker assessment / plan:  Spoke with patient and her daughter linda concerning possibility of rehab at [facility] before returning home.    Employment status:  Retired Forensic scientist:  Medicare PT Recommendations:  [place] / Referral to community resources:  [place]  Patient/Family's Response to care: Patient and  Daughter Vaughan Basta who was contacted via phone   recognizes need  for rehab before returning home and are agreeable to return to SNF patient has been staying at in [facility]. CSW explained insurance authorization process. Patient's family reported that they want patient to get stronger to be able to come back home.    Patient/Family's Understanding of and Emotional Response to Diagnosis, Current Treatment, and Prognosis:  Patient/family is realistic regarding therapy needs and expressed being hopeful for SNF placement and returning to [facility]. Patient expressed understanding of CSW role and discharge process as well as medical condition. No questions/concerns about plan or treatment.    Emotional Assessment Appearance:  Appears stated age Attitude/Demeanor/Rapport:  Unable to Assess Affect (typically observed):  Unable to Assess Orientation:  Oriented to Self, Oriented to Place Alcohol / Substance use:  Not Applicable Psych involvement (Current and /or in the community):  No (Comment)  Discharge Needs  Concerns to be addressed:  Discharge Planning Concerns Readmission within the last 30 days:  No Current discharge risk:  Dependent with Mobility Barriers to Discharge:  Continued Medical Work up   [employer], LCSW 11/01/2018, 10:03 AM

## 2018-11-01 NOTE — Plan of Care (Signed)

## 2018-11-01 NOTE — Progress Notes (Signed)
Pt ambulated to chair by PT this AM. Transferred from chair to bed at 15:45 with 2 assist, Pt denied pain 0/10, tolerated well. Pt now resting in bed, daughter at bedside.

## 2018-11-01 NOTE — Care Management Important Message (Signed)
Important Message  Patient Details  Name: Laura Houston MRN: [id number] Date of Birth: 1923-05-14   Medicare Important Message Given:  Yes    [name] Montine Circle 11/01/2018, 3:53 PM

## 2018-11-01 NOTE — Progress Notes (Signed)
Orthopaedic Trauma Service (OTS)  3 Days Post-Op Procedure(s) (LRB): LEFT HIP HEMIARTHROPLASTY (Left)  Subjective: Patient reports pain as mild.    Objective: Current Vitals Blood pressure (!) 96/51, pulse 86, temperature 98.5 F (36.9 C), temperature source Axillary, resp. rate 16, height 5\' 3"  (1.6 m), weight 68.2 kg, SpO2 92 %. Vital signs in last 24 hours: Temp:  [98.4 F (36.9 C)-99.8 F (37.7 C)] 98.5 F (36.9 C) (11/22 0817) Pulse Rate:  [61-86] 86 (11/22 0817) Resp:  [14-19] 16 (11/22 0817) BP: (96-137)/(51-60) 96/51 (11/22 0817) SpO2:  [92 %-100 %] 92 % (11/22 0817) Weight:  [68.2 kg] 68.2 kg (11/21 1238)  Intake/Output from previous day: 11/21 0701 - 11/22 0700 In: 590 [P.O.:240; Blood:350] Out: 1075 [Urine:1075]  LABS Recent Labs    10/30/18 0516 10/30/18 1355 10/31/18 0231 11/01/18 0207  HGB 6.7* 7.9* 7.5* 7.6*   Recent Labs    10/31/18 0231 11/01/18 0207  WBC 6.0 5.5  RBC 2.58* 2.68*  HCT 23.0* 24.3*  PLT 123* 109*   Recent Labs    10/31/18 0231 11/01/18 0207  NA 138 139  K 4.8 4.6  CL 107 109  CO2 24 25  BUN 46* 53*  CREATININE 2.80* 2.91*  GLUCOSE 117* 86  CALCIUM 7.9* 7.9*   No results for input(s): LABPT, INR in the last 72 hours.   Physical Exam Alert, talkative, and eating LLEDressing intact, clean, dry, one spot of drainage Edema/ swelling controlled Sens: DPN, SPN, TN intact Motor: EHL, FHL, and lessor toe ext and flex all intact   Assessment/Plan: 3 Days Post-Op Procedure(s) (LRB): LEFT HIP HEMIARTHROPLASTY (Left) 1. PT/OT today PLUS NURSING TO PLACE IN CHAIR THIS PM 2. DVT proph Other (comment) ASA 3. F/u 8-14 days  Altamese Hooker, MD [facility] [phone] (p)

## 2018-11-01 NOTE — Plan of Care (Signed)
  Problem: Activity: Goal: Ability to ambulate and perform ADLs will improve Outcome: Progressing   Problem: Self-Concept: Goal: Ability to maintain and perform role responsibilities to the fullest extent possible will improve Outcome: Progressing   Problem: Pain Management: Goal: Pain level will decrease Outcome: Progressing   

## 2018-11-01 NOTE — Progress Notes (Signed)
PROGRESS NOTE  Laura Houston XIP:[id number] DOB: 1923/06/18 DOA: 10/28/2018 PCP: Patient, No Pcp Per   LOS: 3 days   Brief Narrative / Interim history: 84 year old female with history of hypertension, CAD, PAD, CHF, bradycardia, chronic kidney disease stage IV, pacemaker who was in her normal state of health at the assisted living facility and fell from wheelchair while trying to transfer.  She was found to have left femoral neck fracture, orthopedic surgery was consulted and we admitted the patient  Subjective: Pt reports some mild hip pain, feel cold. Denies any chest pain, abdominal pain, worsening SOB, fever/chills.   Assessment & Plan: Principal Problem:   Closed displaced fracture of left femoral neck (HCC) Active Problems:   HYPERTENSION, MILD   Coronary atherosclerosis   Status post placement of cardiac pacemaker   Iron deficiency anemia   Gout   GERD (gastroesophageal reflux disease)   CKD (chronic kidney disease), stage IV (HCC)   Chronic systolic (congestive) heart failure (HCC)   Fall   Hyperkalemia   Protein-calorie malnutrition, severe   Fracture of the left femoral neck s/p L hemiarthroplasty on 10/29/18  -Management per Orthopedics: pain management, DVT ppx -PT rec SNF  Acute blood loss anemia /underlying iron deficiency anemia -Postop hemoglobin 6.7 from 8.3 prior, transfused 2 units of packed red blood cells on 11/20, 1U on 11/21, hemoglobin still around 7.6, ??bleeding post op  -Daily CBC, monitor closely  Hypertension -Hold home Norvasc, imdur, reduced coreg to 6.25 mg BID, due to soft blood pressure -Monitor closely  Acute kidney injury on chronic kidney disease stage IV -Baseline creatinine from care everywhere 1.8-2.3 -Closely monitor renal function, Lasix x1 given 3 units administered, hold further Lasix Daily BMP  Acute on chronic diastolic CHF -With 2+ pitting edema on admission, received IV Lasix Monitor closely  CAD -continue home  medications, no chest pain  Gout -Continue allopurinol   Scheduled Meds: . allopurinol  100 mg Oral Daily  . aspirin  81 mg Oral Daily  . carvedilol  12.5 mg Oral BID WC  . cholecalciferol  1,000 Units Oral Daily  . escitalopram  10 mg Oral Daily  . feeding supplement (ENSURE ENLIVE)  237 mL Oral TID BM  . ferrous sulfate  325 mg Oral Q breakfast  . isosorbide mononitrate  60 mg Oral Daily  . Melatonin  6 mg Oral QHS  . multivitamin with minerals  1 tablet Oral Daily  . MUSCLE RUB  1 application Topical BID  . pantoprazole  40 mg Oral Daily  . senna  2 tablet Oral Daily  . traZODone  100 mg Oral QHS   Continuous Infusions: PRN Meds:.acetaminophen, hydrALAZINE, methocarbamol, ondansetron (ZOFRAN) IV, oxyCODONE-acetaminophen, polyethylene glycol, traMADol, zolpidem  DVT prophylaxis: per orthopedic surgery  Code Status: DNR Family Communication: No family at bedside this morning Disposition Plan: Likely will need SNF  Consultants:   Orthopedic surgery  Procedures:   None   Antimicrobials:  None    Objective: Vitals:   11/01/18 0509 11/01/18 0817 11/01/18 1234 11/01/18 1358  BP: (!) 111/55 (!) 96/51  (!) 106/55  Pulse: 69 86  78  Resp: 19 16  17   Temp: 99.8 F (37.7 C) 98.5 F (36.9 C) 100 F (37.8 C) 99.9 F (37.7 C)  TempSrc: Oral Axillary Oral Oral  SpO2: 100% 92%  98%  Weight:      Height:        Intake/Output Summary (Last 24 hours) at 11/01/2018 1739 Last data filed at 11/01/2018 1555  Gross per 24 hour  Intake 240 ml  Output 750 ml  Net -510 ml   Filed Weights   10/31/18 1238  Weight: 68.2 kg    Examination:  General: NAD   Cardiovascular: S1, S2 present  Respiratory: CTAB  Abdomen: Soft, nontender, nondistended, bowel sounds present  Musculoskeletal: 1+ bilateral pedal edema noted  Skin: Normal  Psychiatry: Normal mood   Data Reviewed: I have independently reviewed following labs and imaging studies   CBC: Recent Labs  Lab  11-10-2018 2049 10/30/18 0516 10/30/18 1355 10/31/18 0231 11/01/18 0207  WBC 4.2 4.6  --  6.0 5.5  NEUTROABS 3.3  --   --   --   --   HGB 8.3* 6.7* 7.9* 7.5* 7.6*  HCT 28.1* 22.3* 25.4* 23.0* 24.3*  MCV 93.7 91.4  --  89.1 90.7  PLT 178 142*  --  123* 270*   Basic Metabolic Panel: Recent Labs  Lab November 10, 2018 2049 10/30/18 0310 10/31/18 0231 11/01/18 0207  NA 139 139 138 139  K 5.2* 4.5 4.8 4.6  CL 109 106 107 109  CO2 21* 24 24 25   GLUCOSE 115* 107* 117* 86  BUN 38* 32* 46* 53*  CREATININE 2.31* 2.05* 2.80* 2.91*  CALCIUM 9.2 8.8* 7.9* 7.9*   GFR: Estimated Creatinine Clearance: 11.2 mL/min (A) (by C-G formula based on SCr of 2.91 mg/dL (H)). Liver Function Tests: No results for input(s): AST, ALT, ALKPHOS, BILITOT, PROT, ALBUMIN in the last 168 hours. No results for input(s): LIPASE, AMYLASE in the last 168 hours. No results for input(s): AMMONIA in the last 168 hours. Coagulation Profile: Recent Labs  Lab 11-10-2018 2049  INR 1.01   Cardiac Enzymes: No results for input(s): CKTOTAL, CKMB, CKMBINDEX, TROPONINI in the last 168 hours. BNP (last 3 results) No results for input(s): PROBNP in the last 8760 hours. HbA1C: No results for input(s): HGBA1C in the last 72 hours. CBG: No results for input(s): GLUCAP in the last 168 hours. Lipid Profile: No results for input(s): CHOL, HDL, LDLCALC, TRIG, CHOLHDL, LDLDIRECT in the last 72 hours. Thyroid Function Tests: No results for input(s): TSH, T4TOTAL, FREET4, T3FREE, THYROIDAB in the last 72 hours. Anemia Panel: No results for input(s): VITAMINB12, FOLATE, FERRITIN, TIBC, IRON, RETICCTPCT in the last 72 hours. Urine analysis:    Component Value Date/Time   COLORURINE YELLOW 01/24/2011 1324   APPEARANCEUR CLEAR 01/24/2011 1324   LABSPEC 1.015 01/24/2011 1324   PHURINE 6.0 01/24/2011 1324   GLUCOSEU NEGATIVE 09/05/2010 1038   HGBUR NEGATIVE 01/24/2011 1324   BILIRUBINUR NEGATIVE 01/24/2011 1324   KETONESUR NEGATIVE  01/24/2011 1324   PROTEINUR NEGATIVE 01/24/2011 1324   UROBILINOGEN 0.2 01/24/2011 1324   NITRITE NEGATIVE 01/24/2011 1324   LEUKOCYTESUR  01/24/2011 1324    NEGATIVE MICROSCOPIC NOT DONE ON URINES WITH NEGATIVE PROTEIN, BLOOD, LEUKOCYTES, NITRITE, OR GLUCOSE <1000 mg/dL.   Sepsis Labs: Invalid input(s): PROCALCITONIN, LACTICIDVEN  Recent Results (from the past 240 hour(s))  Surgical pcr screen     Status: None   Collection Time: 10/29/18  3:26 AM  Result Value Ref Range Status   MRSA, PCR NEGATIVE NEGATIVE Final   Staphylococcus aureus NEGATIVE NEGATIVE Final    Comment: (NOTE) The Xpert SA Assay (FDA approved for NASAL specimens in patients 63 years of age and older), is one component of a comprehensive surveillance program. It is not intended to diagnose infection nor to guide or monitor treatment. Performed at [facility], [place] [address], [place], [place]  [place]       Radiology Studies: No results found.  [facility]   If 7PM-7AM, please contact night-coverage [email or web address] 11/01/2018, 5:39 PM

## 2018-11-02 ENCOUNTER — Inpatient Hospital Stay (HOSPITAL_COMMUNITY): Payer: Medicare Other

## 2018-11-02 LAB — CBC WITH DIFFERENTIAL/PLATELET
ABS IMMATURE GRANULOCYTES: 0.03 10*3/uL (ref 0.00–0.07)
BASOS ABS: 0 10*3/uL (ref 0.0–0.1)
Basophils Relative: 0 %
EOS ABS: 0 10*3/uL (ref 0.0–0.5)
Eosinophils Relative: 0 %
HEMATOCRIT: 24.8 % — AB (ref 36.0–46.0)
Hemoglobin: 7.6 g/dL — ABNORMAL LOW (ref 12.0–15.0)
Immature Granulocytes: 1 %
LYMPHS ABS: 0.7 10*3/uL (ref 0.7–4.0)
LYMPHS PCT: 12 %
MCH: 28.1 pg (ref 26.0–34.0)
MCHC: 30.6 g/dL (ref 30.0–36.0)
MCV: 91.9 fL (ref 80.0–100.0)
MONOS PCT: 10 %
Monocytes Absolute: 0.6 10*3/uL (ref 0.1–1.0)
NEUTROS PCT: 77 %
NRBC: 0 % (ref 0.0–0.2)
Neutro Abs: 4.2 10*3/uL (ref 1.7–7.7)
PLATELETS: 146 10*3/uL — AB (ref 150–400)
RBC: 2.7 MIL/uL — ABNORMAL LOW (ref 3.87–5.11)
RDW: 14.9 % (ref 11.5–15.5)
WBC: 5.5 10*3/uL (ref 4.0–10.5)

## 2018-11-02 LAB — URINALYSIS, ROUTINE W REFLEX MICROSCOPIC
BILIRUBIN URINE: NEGATIVE
GLUCOSE, UA: NEGATIVE mg/dL
Ketones, ur: NEGATIVE mg/dL
Nitrite: NEGATIVE
PH: 6 (ref 5.0–8.0)
Protein, ur: 30 mg/dL — AB
SPECIFIC GRAVITY, URINE: 1.013 (ref 1.005–1.030)
WBC, UA: >50 WBC/hpf — ABNORMAL HIGH (ref 0–5)

## 2018-11-02 LAB — BASIC METABOLIC PANEL
ANION GAP: 8 (ref 5–15)
BUN: 62 mg/dL — ABNORMAL HIGH (ref 8–23)
CO2: 24 mmol/L (ref 22–32)
Calcium: 8.1 mg/dL — ABNORMAL LOW (ref 8.9–10.3)
Chloride: 105 mmol/L (ref 98–111)
Creatinine, Ser: 3.17 mg/dL — ABNORMAL HIGH (ref 0.44–1.00)
GFR calc Af Amer: 14 mL/min — ABNORMAL LOW (ref >60)
GFR, EST NON AFRICAN AMERICAN: 12 mL/min — AB (ref >60)
GLUCOSE: 97 mg/dL (ref 70–99)
POTASSIUM: 5 mmol/L (ref 3.5–5.1)
Sodium: 137 mmol/L (ref 135–145)

## 2018-11-02 NOTE — Progress Notes (Addendum)
PROGRESS NOTE  Laura Houston WUJ:[id number] DOB: [birth year] DOA: 10/28/2018 PCP: Patient, No Pcp Per   LOS: 4 days   Brief Narrative / Interim history: 84 year old female with history of hypertension, CAD, PAD, CHF, bradycardia, chronic kidney disease stage IV, pacemaker who was in her normal state of health at the assisted living facility and fell from wheelchair while trying to transfer.  She was found to have left femoral neck fracture, orthopedic surgery was consulted and we admitted the patient  Subjective: Pt denies any new complains. Met pt resting in bed  Assessment & Plan: Principal Problem:   Closed displaced fracture of left femoral neck (HCC) Active Problems:   HYPERTENSION, MILD   Coronary atherosclerosis   Status post placement of cardiac pacemaker   Iron deficiency anemia   Gout   GERD (gastroesophageal reflux disease)   CKD (chronic kidney disease), stage IV (HCC)   Chronic systolic (congestive) heart failure (HCC)   Fall   Hyperkalemia   Protein-calorie malnutrition, severe   Fracture of the left femoral neck s/p L hemiarthroplasty on 10/29/18  -Management per Orthopedics: pain management, DVT ppx -PT rec SNF  Acute blood loss anemia /underlying iron deficiency anemia -Postop hemoglobin 6.7 from 8.3 prior, transfused 2 units of packed red blood cells on 11/20, 1U on 11/21, hemoglobin still around 7.6, ??bleeding post op Vs worsening renal fxn  -Daily CBC, monitor closely  Hypertension -Hold home Norvasc, imdur, reduced coreg to 6.25 mg BID, due to soft blood pressure -Monitor closely  Acute kidney injury on chronic kidney disease stage IV-->stage V Worsening -Baseline creatinine from care everywhere 1.8-2.3, today 3.17 -Closely monitor renal function, Lasix x1 given 3 units administered, hold further Lasix Renal USS pending May consult nephrology Daily BMP  Acute on chronic diastolic CHF -With 2+ pitting edema on admission, received IV  Lasix Repeat ECHO pending Monitor closely  CAD -continue home medications, no chest pain  Gout -Continue allopurinol  [facility] Palliative team consulted   Scheduled Meds: . allopurinol  100 mg Oral Daily  . aspirin  81 mg Oral Daily  . carvedilol  6.25 mg Oral BID WC  . cholecalciferol  1,000 Units Oral Daily  . escitalopram  10 mg Oral Daily  . feeding supplement (ENSURE ENLIVE)  237 mL Oral TID BM  . ferrous sulfate  325 mg Oral Q breakfast  . Melatonin  6 mg Oral QHS  . multivitamin with minerals  1 tablet Oral Daily  . MUSCLE RUB  1 application Topical BID  . pantoprazole  40 mg Oral Daily  . senna  2 tablet Oral Daily  . traZODone  100 mg Oral QHS   Continuous Infusions: PRN Meds:.acetaminophen, hydrALAZINE, methocarbamol, ondansetron (ZOFRAN) IV, oxyCODONE-acetaminophen, polyethylene glycol, traMADol, zolpidem  DVT prophylaxis: per orthopedic surgery  Code Status: DNR Family Communication: Spoke to daughter POA over the phone Disposition Plan: Likely will need SNF  Consultants:   Orthopedic surgery  Procedures:   None   Antimicrobials:  None    Objective: Vitals:   11/01/18 1234 11/01/18 1358 11/01/18 2025 11/02/18 0419  BP:  (!) 106/55 (!) 114/56 (!) (P) 118/59  Pulse:  78 72 (P) 70  Resp:  17    Temp: 100 F (37.8 C) 99.9 F (37.7 C) 99.5 F (37.5 C) (P) 99.1 F (37.3 C)  TempSrc: Oral Oral Oral (P) Oral  SpO2:  98% 99% (P) 93%  Weight:      Height:        Intake/Output Summary (Last  24 hours) at 11/02/2018 1722 Last data filed at 11/02/2018 1330 Gross per 24 hour  Intake 240 ml  Output 750 ml  Net -510 ml   Filed Weights   10/31/18 1238  Weight: 68.2 kg    Examination:  General: NAD   Cardiovascular: S1, S2 present  Respiratory: CTAB  Abdomen: Soft, nontender, nondistended, bowel sounds present  Musculoskeletal: +1 BLE edema noted  Skin: Normal  Psychiatry: Normal mood   Data Reviewed: I have independently reviewed  following labs and imaging studies   CBC: Recent Labs  Lab 10/28/18 2049 10/30/18 0516 10/30/18 1355 10/31/18 0231 11/01/18 0207 11/02/18 0259  WBC 4.2 4.6  --  6.0 5.5 5.5  NEUTROABS 3.3  --   --   --   --  4.2  HGB 8.3* 6.7* 7.9* 7.5* 7.6* 7.6*  HCT 28.1* 22.3* 25.4* 23.0* 24.3* 24.8*  MCV 93.7 91.4  --  89.1 90.7 91.9  PLT 178 142*  --  123* 109* 997*   Basic Metabolic Panel: Recent Labs  Lab 10/28/18 2049 10/30/18 0310 10/31/18 0231 11/01/18 0207 11/02/18 0259  NA 139 139 138 139 137  K 5.2* 4.5 4.8 4.6 5.0  CL 109 106 107 109 105  CO2 21* '24 24 25 24  ' GLUCOSE 115* 107* 117* 86 97  BUN 38* 32* 46* 53* 62*  CREATININE 2.31* 2.05* 2.80* 2.91* 3.17*  CALCIUM 9.2 8.8* 7.9* 7.9* 8.1*   GFR: Estimated Creatinine Clearance: 10.3 mL/min (A) (by C-G formula based on SCr of 3.17 mg/dL (H)). Liver Function Tests: No results for input(s): AST, ALT, ALKPHOS, BILITOT, PROT, ALBUMIN in the last 168 hours. No results for input(s): LIPASE, AMYLASE in the last 168 hours. No results for input(s): AMMONIA in the last 168 hours. Coagulation Profile: Recent Labs  Lab 10/28/18 2049  INR 1.01   Cardiac Enzymes: No results for input(s): CKTOTAL, CKMB, CKMBINDEX, TROPONINI in the last 168 hours. BNP (last 3 results) No results for input(s): PROBNP in the last 8760 hours. HbA1C: No results for input(s): HGBA1C in the last 72 hours. CBG: No results for input(s): GLUCAP in the last 168 hours. Lipid Profile: No results for input(s): CHOL, HDL, LDLCALC, TRIG, CHOLHDL, LDLDIRECT in the last 72 hours. Thyroid Function Tests: No results for input(s): TSH, T4TOTAL, FREET4, T3FREE, THYROIDAB in the last 72 hours. Anemia Panel: No results for input(s): VITAMINB12, FOLATE, FERRITIN, TIBC, IRON, RETICCTPCT in the last 72 hours. Urine analysis:    Component Value Date/Time   COLORURINE AMBER (A) 11/02/2018 1510   APPEARANCEUR CLOUDY (A) 11/02/2018 1510   LABSPEC 1.013 11/02/2018 1510    PHURINE 6.0 11/02/2018 1510   GLUCOSEU NEGATIVE 11/02/2018 1510   HGBUR SMALL (A) 11/02/2018 1510   BILIRUBINUR NEGATIVE 11/02/2018 1510   KETONESUR NEGATIVE 11/02/2018 1510   PROTEINUR 30 (A) 11/02/2018 1510   UROBILINOGEN 0.2 01/24/2011 1324   NITRITE NEGATIVE 11/02/2018 1510   LEUKOCYTESUR LARGE (A) 11/02/2018 1510   Sepsis Labs: Invalid input(s): PROCALCITONIN, LACTICIDVEN  Recent Results (from the past 240 hour(s))  Surgical pcr screen     Status: None   Collection Time: 10/29/18  3:26 AM  Result Value Ref Range Status   MRSA, PCR NEGATIVE NEGATIVE Final   Staphylococcus aureus NEGATIVE NEGATIVE Final    Comment: (NOTE) The Xpert SA Assay (FDA approved for NASAL specimens in patients 21 years of age and older), is one component of a comprehensive surveillance program. It is not intended to diagnose infection nor to guide or  monitor treatment. Performed at [facility], [place] [address], [place], [place] [address]       Radiology Studies: Dg Chest Port 1 View  Result Date: 11/02/2018 CLINICAL DATA:  Dyspnea EXAM: PORTABLE CHEST 1 VIEW COMPARISON:  Radiographs 10/28/2018 and 01/24/2011 FINDINGS: 0821 hours. The left subclavian pacemaker leads appear unchanged in the right atrium and right ventricle. There is stable cardiomegaly and diffuse aortic atherosclerosis. There is chronic elevation of the right hemidiaphragm with a new small right pleural effusion and mildly increased atelectasis at the right lung base. The left lung is clear. There is no pneumothorax. Advanced glenohumeral degenerative changes are present bilaterally. No acute osseous findings are seen. IMPRESSION: 1. New small right pleural effusion with mildly increased adjacent right basilar atelectasis. No edema or confluent airspace opacity. 2. Stable cardiomegaly and aortic atherosclerosis. Electronically Signed   By: Richardean Sale M.D.   On: 11/02/2018 08:46    [facility]   If 7PM-7AM, please contact night-coverage [email or web address] 11/02/2018, 5:22 PM

## 2018-11-03 DIAGNOSIS — S72002A Fracture of unspecified part of neck of left femur, initial encounter for closed fracture: Principal | ICD-10-CM

## 2018-11-03 LAB — CBC WITH DIFFERENTIAL/PLATELET
ABS IMMATURE GRANULOCYTES: 0.02 10*3/uL (ref 0.00–0.07)
Basophils Absolute: 0 10*3/uL (ref 0.0–0.1)
Basophils Relative: 0 %
EOS ABS: 0 10*3/uL (ref 0.0–0.5)
Eosinophils Relative: 0 %
HCT: 27.5 % — ABNORMAL LOW (ref 36.0–46.0)
Hemoglobin: 8.6 g/dL — ABNORMAL LOW (ref 12.0–15.0)
IMMATURE GRANULOCYTES: 0 %
LYMPHS ABS: 0.7 10*3/uL (ref 0.7–4.0)
Lymphocytes Relative: 14 %
MCH: 28.7 pg (ref 26.0–34.0)
MCHC: 31.3 g/dL (ref 30.0–36.0)
MCV: 91.7 fL (ref 80.0–100.0)
MONOS PCT: 9 %
Monocytes Absolute: 0.5 10*3/uL (ref 0.1–1.0)
NEUTROS PCT: 77 %
Neutro Abs: 3.6 10*3/uL (ref 1.7–7.7)
Platelets: 155 10*3/uL (ref 150–400)
RBC: 3 MIL/uL — ABNORMAL LOW (ref 3.87–5.11)
RDW: 14.6 % (ref 11.5–15.5)
WBC: 4.8 10*3/uL (ref 4.0–10.5)
nRBC: 0 % (ref 0.0–0.2)

## 2018-11-03 LAB — BASIC METABOLIC PANEL
ANION GAP: 6 (ref 5–15)
BUN: 56 mg/dL — ABNORMAL HIGH (ref 8–23)
CO2: 26 mmol/L (ref 22–32)
Calcium: 8.8 mg/dL — ABNORMAL LOW (ref 8.9–10.3)
Chloride: 106 mmol/L (ref 98–111)
Creatinine, Ser: 2.66 mg/dL — ABNORMAL HIGH (ref 0.44–1.00)
GFR calc non Af Amer: 14 mL/min — ABNORMAL LOW (ref >60)
GFR, EST AFRICAN AMERICAN: 17 mL/min — AB (ref >60)
GLUCOSE: 88 mg/dL (ref 70–99)
Potassium: 4.9 mmol/L (ref 3.5–5.1)
Sodium: 138 mmol/L (ref 135–145)

## 2018-11-03 MED ORDER — FENTANYL 50 MCG/HR TD PT72
75.0000 ug | MEDICATED_PATCH | TRANSDERMAL | Status: DC
  Administered 2018-11-03: 75 ug via TRANSDERMAL
  Filled 2018-11-03: qty 1

## 2018-11-03 MED ORDER — BISACODYL 5 MG PO TBEC
5.0000 mg | DELAYED_RELEASE_TABLET | Freq: Every day | ORAL | Status: DC | PRN
  Administered 2018-11-03 – 2018-11-04 (×2): 5 mg via ORAL
  Filled 2018-11-03 (×2): qty 1

## 2018-11-03 NOTE — Progress Notes (Signed)
Palliative care consult received and chart reviewed. Noted patient improvement. Discussed case with Dr. Horris Latino - palliative will not engage at this time. PMT will shadow chart for decline. If patient needs outpatient palliative care, please write for this is discharge summary.   Thank you for this consult.  Juel Burrow, DNP, AGNP-C Palliative Medicine Team Team Phone # [phone]  Pager # [phone]  NO CHARGE

## 2018-11-03 NOTE — Plan of Care (Signed)

## 2018-11-03 NOTE — Progress Notes (Signed)
PROGRESS NOTE  Laura Houston VHQ:[id number] DOB: 04/01/1923 DOA: 10/28/2018 PCP: Patient, No Pcp Per   LOS: 5 days   Brief Narrative / Interim history: 84 year old female with history of hypertension, CAD, PAD, CHF, bradycardia, chronic kidney disease stage IV, pacemaker who was in her normal state of health at the assisted living facility and fell from wheelchair while trying to transfer.  She was found to have left femoral neck fracture, orthopedic surgery was consulted and we admitted the patient  Subjective: Today, patient complaining about her left lower extremity, noted some pain.  Reported some ??dysuria after I asked her. (Unsure if this is true).  Denies any fever/chills, abdominal pain, chest pain, shortness of breath.  Assessment & Plan: Principal Problem:   Closed displaced fracture of left femoral neck (HCC) Active Problems:   HYPERTENSION, MILD   Coronary atherosclerosis   Status post placement of cardiac pacemaker   Iron deficiency anemia   Gout   GERD (gastroesophageal reflux disease)   CKD (chronic kidney disease), stage IV (HCC)   Chronic systolic (congestive) heart failure (HCC)   Fall   Hyperkalemia   Protein-calorie malnutrition, severe   Fracture of the left femoral neck s/p L hemiarthroplasty on 10/29/18  -Management per Orthopedics: pain management, DVT ppx -PT rec SNF  ??UTI Responded yes when asked about dysuria (not so reliable) UA showed large leukocytes, many bacteria, greater than 50 WBC UC pending Due to allergy to penicillin, with very poor renal function, will hold off on starting any antibiotics empirically for now  Acute blood loss anemia /underlying iron deficiency anemia -Postop hemoglobin 6.7 from 8.3 prior, transfused 2 units of packed red blood cells on 11/20, 1U on 11/21, hemoglobin improved to 8.6  -Daily CBC, monitor closely  Hypertension -Hold home Norvasc, imdur, reduced coreg to 6.25 mg BID, due to soft blood  pressure -Monitor closely  Acute kidney injury on chronic kidney disease stage IV-->stage V Significantly improved -Baseline creatinine from care everywhere 1.8-2.3, today 2.66  -Closely monitor renal function, Lasix x1 given 3 units administered, hold further Lasix Renal USS showed no obstruction, but showed 2 lesions in the right kidney, largest about 3.9 x 3.9 x 3.8.  Recommend nonemergent MRI of the abdomen in the near future to evaluate for potential neoplasm Daily BMP  Acute on chronic diastolic CHF -With 2+ pitting edema on admission, received IV Lasix Repeat ECHO pending Monitor closely  CAD -continue home medications, no chest pain  Gout -Continue allopurinol  [facility] Palliative team consulted, will follow patient as an outpatient unless if clinical deterioration happens here in the hospital.   Scheduled Meds: . allopurinol  100 mg Oral Daily  . aspirin  81 mg Oral Daily  . carvedilol  6.25 mg Oral BID WC  . cholecalciferol  1,000 Units Oral Daily  . escitalopram  10 mg Oral Daily  . feeding supplement (ENSURE ENLIVE)  237 mL Oral TID BM  . ferrous sulfate  325 mg Oral Q breakfast  . Melatonin  6 mg Oral QHS  . multivitamin with minerals  1 tablet Oral Daily  . MUSCLE RUB  1 application Topical BID  . pantoprazole  40 mg Oral Daily  . senna  2 tablet Oral Daily  . traZODone  100 mg Oral QHS   Continuous Infusions: PRN Meds:.acetaminophen, hydrALAZINE, methocarbamol, ondansetron (ZOFRAN) IV, oxyCODONE-acetaminophen, polyethylene glycol, traMADol, zolpidem  DVT prophylaxis: per orthopedic surgery  Code Status: DNR Family Communication: Spoke to daughter POA over the phone Disposition Plan: Likely  will need SNF  Consultants:   Orthopedic surgery  Procedures:   None   Antimicrobials:  None  Objective: Vitals:   11/02/18 1512 11/02/18 2007 11/03/18 0322 11/03/18 1500  BP: (!) 141/70 132/65 138/73 140/72  Pulse: 72 69 67 69  Resp: 18     Temp:  99.4 F  (37.4 C) 98 F (36.7 C) 98.8 F (37.1 C)  TempSrc:  Oral Oral Oral  SpO2: 95% 98% 98% 98%  Weight:      Height:        Intake/Output Summary (Last 24 hours) at 11/03/2018 1736 Last data filed at 11/03/2018 1300 Gross per 24 hour  Intake 480 ml  Output 1300 ml  Net -820 ml   Filed Weights   10/31/18 1238  Weight: 68.2 kg    Examination:  General: NAD   Cardiovascular: S1, S2 present  Respiratory: CTAB  Abdomen: Soft, nontender, nondistended, bowel sounds present  Musculoskeletal: Trace bilateral pedal edema noted  Skin: Normal  Psychiatry: Normal mood   Data Reviewed: I have independently reviewed following labs and imaging studies   CBC: Recent Labs  Lab 10/28/18 2049 10/30/18 0516 10/30/18 1355 10/31/18 0231 11/01/18 0207 11/02/18 0259 11/03/18 0443  WBC 4.2 4.6  --  6.0 5.5 5.5 4.8  NEUTROABS 3.3  --   --   --   --  4.2 3.6  HGB 8.3* 6.7* 7.9* 7.5* 7.6* 7.6* 8.6*  HCT 28.1* 22.3* 25.4* 23.0* 24.3* 24.8* 27.5*  MCV 93.7 91.4  --  89.1 90.7 91.9 91.7  PLT 178 142*  --  123* 109* 146* 496   Basic Metabolic Panel: Recent Labs  Lab 10/30/18 0310 10/31/18 0231 11/01/18 0207 11/02/18 0259 11/03/18 0443  NA 139 138 139 137 138  K 4.5 4.8 4.6 5.0 4.9  CL 106 107 109 105 106  CO2 24 24 25 24 26   GLUCOSE 107* 117* 86 97 88  BUN 32* 46* 53* 62* 56*  CREATININE 2.05* 2.80* 2.91* 3.17* 2.66*  CALCIUM 8.8* 7.9* 7.9* 8.1* 8.8*   GFR: Estimated Creatinine Clearance: 12.2 mL/min (A) (by C-G formula based on SCr of 2.66 mg/dL (H)). Liver Function Tests: No results for input(s): AST, ALT, ALKPHOS, BILITOT, PROT, ALBUMIN in the last 168 hours. No results for input(s): LIPASE, AMYLASE in the last 168 hours. No results for input(s): AMMONIA in the last 168 hours. Coagulation Profile: Recent Labs  Lab 10/28/18 2049  INR 1.01   Cardiac Enzymes: No results for input(s): CKTOTAL, CKMB, CKMBINDEX, TROPONINI in the last 168 hours. BNP (last 3 results) No  results for input(s): PROBNP in the last 8760 hours. HbA1C: No results for input(s): HGBA1C in the last 72 hours. CBG: No results for input(s): GLUCAP in the last 168 hours. Lipid Profile: No results for input(s): CHOL, HDL, LDLCALC, TRIG, CHOLHDL, LDLDIRECT in the last 72 hours. Thyroid Function Tests: No results for input(s): TSH, T4TOTAL, FREET4, T3FREE, THYROIDAB in the last 72 hours. Anemia Panel: No results for input(s): VITAMINB12, FOLATE, FERRITIN, TIBC, IRON, RETICCTPCT in the last 72 hours. Urine analysis:    Component Value Date/Time   COLORURINE AMBER (A) 11/02/2018 1510   APPEARANCEUR CLOUDY (A) 11/02/2018 1510   LABSPEC 1.013 11/02/2018 1510   PHURINE 6.0 11/02/2018 1510   GLUCOSEU NEGATIVE 11/02/2018 1510   HGBUR SMALL (A) 11/02/2018 1510   BILIRUBINUR NEGATIVE 11/02/2018 1510   KETONESUR NEGATIVE 11/02/2018 1510   PROTEINUR 30 (A) 11/02/2018 1510   UROBILINOGEN 0.2 01/24/2011 1324   NITRITE  NEGATIVE 11/02/2018 1510   LEUKOCYTESUR LARGE (A) 11/02/2018 1510   Sepsis Labs: Invalid input(s): PROCALCITONIN, LACTICIDVEN  Recent Results (from the past 240 hour(s))  Surgical pcr screen     Status: None   Collection Time: 10/29/18  3:26 AM  Result Value Ref Range Status   MRSA, PCR NEGATIVE NEGATIVE Final   Staphylococcus aureus NEGATIVE NEGATIVE Final    Comment: (NOTE) The Xpert SA Assay (FDA approved for NASAL specimens in patients 7 years of age and older), is one component of a comprehensive surveillance program. It is not intended to diagnose infection nor to guide or monitor treatment. Performed at [facility], [place] [address] [place], [place] [address]       Radiology Studies: US Renal  Result Date: 11/02/2018 CLINICAL DATA:  84 year old female with history of colon cancer and chronic kidney disease (stage IV). EXAM: RENAL / URINARY TRACT ULTRASOUND COMPLETE COMPARISON:  None. FINDINGS: Right Kidney: Renal measurements: 11.7 x 4.6 x 4.9 cm =  volume: 138.6 mL. Diffusely increased echogenicity indicative of medical renal disease. 3.9 x 3.9 x 3.8 cm heterogeneously echogenic mass in the lower pole. In the upper pole there is a 2.0 x 1.0 x 1.4 cm heterogeneously anechoic lesion with multiple internal septations and increased through transmission. No hydronephrosis. Left Kidney: Renal measurements: 5.8 x 3.4 x 4.1 cm = volume: 41.9 mL. Atrophic kidney that is poorly visualized, but grossly unremarkable in appearance. No hydronephrosis. Bladder: Appears normal for degree of bladder distention. Other: Trace volume of ascites. IMPRESSION: 1. There are 2 indeterminate lesions in the right kidney, largest and most concerning of which is a solid-appearing mass in the lower pole measuring 3.9 x 3.9 x 3.8 cm. Further characterization with nonemergent MRI of the abdomen with and without IV gadolinium is strongly recommended in the near future to evaluate for potential neoplasm. 2. Atrophy of the left kidney. Increased echogenicity throughout the right renal parenchyma, indicative of medical renal disease. 3. No findings to suggest urinary tract obstruction. 4. Trace volume of ascites. Electronically Signed   By: Vinnie Langton M.D.   On: 11/02/2018 20:15   Dg Chest Port 1 View  Result Date: 11/02/2018 CLINICAL DATA:  Dyspnea EXAM: PORTABLE CHEST 1 VIEW COMPARISON:  Radiographs 10/28/2018 and 01/24/2011 FINDINGS: 0821 hours. The left subclavian pacemaker leads appear unchanged in the right atrium and right ventricle. There is stable cardiomegaly and diffuse aortic atherosclerosis. There is chronic elevation of the right hemidiaphragm with a new small right pleural effusion and mildly increased atelectasis at the right lung base. The left lung is clear. There is no pneumothorax. Advanced glenohumeral degenerative changes are present bilaterally. No acute osseous findings are seen. IMPRESSION: 1. New small right pleural effusion with mildly increased adjacent  right basilar atelectasis. No edema or confluent airspace opacity. 2. Stable cardiomegaly and aortic atherosclerosis. Electronically Signed   By: Richardean Sale M.D.   On: 11/02/2018 08:46    [facility]   If 7PM-7AM, please contact night-coverage [email or web address] 11/03/2018, 5:36 PM

## 2018-11-04 ENCOUNTER — Inpatient Hospital Stay (HOSPITAL_COMMUNITY): Payer: Medicare Other

## 2018-11-04 DIAGNOSIS — E43 Unspecified severe protein-calorie malnutrition: Secondary | ICD-10-CM

## 2018-11-04 DIAGNOSIS — I351 Nonrheumatic aortic (valve) insufficiency: Secondary | ICD-10-CM

## 2018-11-04 LAB — CBC WITH DIFFERENTIAL/PLATELET
ABS IMMATURE GRANULOCYTES: 0.02 10*3/uL (ref 0.00–0.07)
Basophils Absolute: 0 10*3/uL (ref 0.0–0.1)
Basophils Relative: 0 %
EOS PCT: 0 %
Eosinophils Absolute: 0 10*3/uL (ref 0.0–0.5)
HCT: 29 % — ABNORMAL LOW (ref 36.0–46.0)
Hemoglobin: 8.7 g/dL — ABNORMAL LOW (ref 12.0–15.0)
Immature Granulocytes: 0 %
LYMPHS ABS: 0.7 10*3/uL (ref 0.7–4.0)
LYMPHS PCT: 13 %
MCH: 28 pg (ref 26.0–34.0)
MCHC: 30 g/dL (ref 30.0–36.0)
MCV: 93.2 fL (ref 80.0–100.0)
MONO ABS: 0.4 10*3/uL (ref 0.1–1.0)
MONOS PCT: 8 %
NEUTROS ABS: 3.9 10*3/uL (ref 1.7–7.7)
Neutrophils Relative %: 79 %
Platelets: 142 10*3/uL — ABNORMAL LOW (ref 150–400)
RBC: 3.11 MIL/uL — AB (ref 3.87–5.11)
RDW: 14.3 % (ref 11.5–15.5)
WBC: 4.9 10*3/uL (ref 4.0–10.5)
nRBC: 0 % (ref 0.0–0.2)

## 2018-11-04 LAB — BASIC METABOLIC PANEL
Anion gap: 6 (ref 5–15)
BUN: 50 mg/dL — AB (ref 8–23)
CO2: 25 mmol/L (ref 22–32)
CREATININE: 2.38 mg/dL — AB (ref 0.44–1.00)
Calcium: 9 mg/dL (ref 8.9–10.3)
Chloride: 106 mmol/L (ref 98–111)
GFR calc Af Amer: 19 mL/min — ABNORMAL LOW (ref >60)
GFR, EST NON AFRICAN AMERICAN: 16 mL/min — AB (ref >60)
Glucose, Bld: 80 mg/dL (ref 70–99)
POTASSIUM: 5.6 mmol/L — AB (ref 3.5–5.1)
Sodium: 137 mmol/L (ref 135–145)

## 2018-11-04 LAB — ECHOCARDIOGRAM COMPLETE
Height: 63 in
WEIGHTICAEL: 2406.4 [oz_av]

## 2018-11-04 LAB — POTASSIUM: Potassium: 5.5 mmol/L — ABNORMAL HIGH (ref 3.5–5.1)

## 2018-11-04 MED ORDER — FUROSEMIDE 10 MG/ML IJ SOLN
20.0000 mg | Freq: Once | INTRAMUSCULAR | Status: AC
  Administered 2018-11-04: 20 mg via INTRAVENOUS
  Filled 2018-11-04: qty 2

## 2018-11-04 MED ORDER — PATIROMER SORBITEX CALCIUM 8.4 G PO PACK
16.8000 g | PACK | Freq: Once | ORAL | Status: DC
  Filled 2018-11-04: qty 2

## 2018-11-04 MED ORDER — SODIUM POLYSTYRENE SULFONATE 15 GM/60ML PO SUSP
30.0000 g | Freq: Once | ORAL | Status: AC
  Administered 2018-11-04: 30 g via ORAL
  Filled 2018-11-04: qty 120

## 2018-11-04 MED ORDER — BISACODYL 10 MG RE SUPP
10.0000 mg | Freq: Once | RECTAL | Status: AC
  Administered 2018-11-04: 10 mg via RECTAL
  Filled 2018-11-04: qty 1

## 2018-11-04 MED ORDER — PATIROMER SORBITEX CALCIUM 8.4 G PO PACK
8.4000 g | PACK | Freq: Every day | ORAL | Status: DC
  Administered 2018-11-04 – 2018-11-05 (×2): 8.4 g via ORAL
  Filled 2018-11-04 (×2): qty 1

## 2018-11-04 NOTE — Progress Notes (Signed)
PROGRESS NOTE  Laura Houston  QJJ:[id number] DOB: [birth year] DOA: 10/28/2018 PCP: Patient, No Pcp Per   Brief Narrative: Laura Houston is a 84 year old female with history of hypertension, CAD, PAD, CHF, bradycardia, chronic kidney disease stage IV, pacemaker who was in her normal state of health at the assisted living facility and fell from wheelchair while trying to transfer. She was found to have a displaced left femoral neck fracture, orthopedic surgery was consulted and Dr. Marcelino Scot performed left hip arthroplasty on 10/29/2018. Postoperative course was complicated by blood loss anemia requiring transfusions and hyperkalemia.   Assessment & Plan: Principal Problem:   Closed displaced fracture of left femoral neck (HCC) Active Problems:   HYPERTENSION, MILD   Coronary atherosclerosis   Status post placement of cardiac pacemaker   Iron deficiency anemia   Gout   GERD (gastroesophageal reflux disease)   CKD (chronic kidney disease), stage IV (HCC)   Chronic systolic (congestive) heart failure (HCC)   Fall   Hyperkalemia   Protein-calorie malnutrition, severe  Fracture of the left femoral neck s/p left hemiarthroplasty on 10/29/18 by Dr. Marcelino Scot  - Management per orthopedics: WBAT, ASA for DVT ppx.  - Needs continued PT at SNF, had high level of functional independence per her daughter PTA.  Asymptomatic bacteriuria: GNRs on non-clean catch in pt without symptoms. Also no fever, objective exam findings, or leukocytosis to suggest UTI.  - Will defer antibiotics for now. Low threshold to start abx if develops SIRS. High risk with abx given renal insufficiency and drug allergies.   Acute blood loss anemia on chronic iron deficiency anemia: Received 2u PRBCs 11/20, 1u 11/21 with hgb up to 8.6 and stable. No bleeding. - Continue po iron. - Monitor CBC  Hypertension:  - Hold home norvasc, imdur, reduced coreg to 6.25 mg BID, due to soft blood pressure  Acute kidney injury on  chronic kidney disease stage IV: May be approaching stage V CKD. Would not be HD candidate due to age and frailty. No hydro on U/S. - Cr continues to improve. Will give additional lasix x1 today as below. Recheck BMP in AM.  - Avoid nephrotoxins as able.  Hyperkalemia: Mild in setting of improving creatinine, constipation.  - Kayexalate, suppository, continue monitoring and give enema if needed.  - Give lasix as above and recheck in PM. If remains elevated, will give further K-lowering agents. - Pt is DNR, will not monitor on telemetry at this time.  Right renal lesions: Incidentally noted on U/S 2 lesions in the right kidney, largest about 3.9 x 3.9 x 3.8.  - Recommend nonemergent MRI of the abdomen in the near future to evaluate for potential neoplasm  Acute on chronicdiastolicCHF: Echo is stable, slightly dilated IVC on echo.  - Continue lasix today, she is near euvolemia.   CAD: No CP. - Continue home medications   Gout: Chronic, no flares. - Continue allopurinol  GOC: Pt is DNR.  - Palliative team consulted, will follow patient as an outpatient unless if clinical deterioration happens here in the hospital.  Severe protein calorie malnutrition:  - Ensure enlive, MVI, magic cup per dietitian recommendations.   DVT prophylaxis: Aspirin per orthopedics Code Status: DNR Family Communication: None at bedside this AM Disposition Plan: SNF once K, hgb, Cr stable.  Consultants:   Orthopedics  Procedures:   Echocardiogram 11/04/2018: - Left ventricle: The cavity size was normal. Systolic function was   normal. The estimated ejection fraction was in the range of 55%   to 60%.  Abnormal septal motion, likely secondary to conduction.   Wall motion was normal; there were no regional wall motion   abnormalities. Doppler parameters are consistent with abnormal   left ventricular relaxation (grade 1 diastolic dysfunction). - Aortic valve: Valve mobility was mildly restricted.  Transvalvular   velocity was within the normal range. There was no stenosis.   There was moderate regurgitation. - Aorta: Aortic root dimension: 43 mm (ED). - Aortic root: The aortic root was mildly dilated. - Mitral valve: There was mild regurgitation. - Left atrium: The atrium was moderately dilated. - Right ventricle: The cavity size was normal. Wall thickness was   normal. Pacer wire or catheter noted in right ventricle. Systolic   function was normal. RV systolic pressure (S, est): 39 mm Hg. - Right atrium: The atrium was mildly dilated. Central venous   pressure (est): 10 mm Hg. - Tricuspid valve: There was mild regurgitation. - Pulmonary arteries: Systolic pressure was mildly to moderately   increased. - Inferior vena cava: The vessel was dilated. The respirophasic   diameter changes were in the normal range (= 50%). - Pericardium, extracardiac: There was no pericardial effusion.  Impressions: - Moderate aortic valve regurgitation with dilated sinuses of   Valsalva (43 mm in approximate dimension), normal LV function,   normal RV function. Mild MR, Mild TR, RVSP 39 mmHg, RA pressure   estimate 10 mmHg. Moderate LA enlargement.  Antimicrobials:  None   Subjective: Pain is controlled with current medications. Denies any urinary symptoms, abdominal pain, back pain, N/V/D, hematuria. No fevers. This morning had not had BM since arrival.  Objective: Vitals:   11/04/18 0300 11/04/18 0615 11/04/18 0939 11/04/18 1311  BP:  (!) 157/63 (!) 150/69 (!) 158/73  Pulse:  63 (!) 59 (!) 59  Resp:    20  Temp:  97.7 F (36.5 C)  98.2 F (36.8 C)  TempSrc:  Oral  Oral  SpO2: 98% 99%  100%  Weight:      Height:        Intake/Output Summary (Last 24 hours) at 11/04/2018 1402 Last data filed at 11/04/2018 0102 Gross per 24 hour  Intake 710 ml  Output 1350 ml  Net -640 ml   Filed Weights   10/31/18 1238  Weight: 68.2 kg    Gen: Elderly female in no distress Pulm:  Non-labored breathing room air. Clear to auscultation bilaterally.  CV: Regular rate and rhythm. No murmur, rub, or gallop. No JVD, trace pedal edema. GI: Abdomen soft, non-tender, non-distended, with normoactive bowel sounds. No organomegaly or masses felt. Ext: Warm, no deformities. Left thigh compartment soft, sensation intact throughout. Strength 5/5 in ankle, knee extension. Cap refill brisk. Skin: No rashes, lesions or ulcers Neuro: Alert, conversant. No focal neurological deficits. Psych: Judgement and insight appear normal. Mood & affect appropriate.   Data Reviewed: I have personally reviewed following labs and imaging studies  CBC: Recent Labs  Lab 10/28/18 2049  10/31/18 0231 11/01/18 0207 11/02/18 0259 11/03/18 0443 11/04/18 0728  WBC 4.2   < > 6.0 5.5 5.5 4.8 4.9  NEUTROABS 3.3  --   --   --  4.2 3.6 3.9  HGB 8.3*   < > 7.5* 7.6* 7.6* 8.6* 8.7*  HCT 28.1*   < > 23.0* 24.3* 24.8* 27.5* 29.0*  MCV 93.7   < > 89.1 90.7 91.9 91.7 93.2  PLT 178   < > 123* 109* 146* 155 142*   < > = values in this interval  not displayed.   Basic Metabolic Panel: Recent Labs  Lab 10/31/18 0231 11/01/18 0207 11/02/18 0259 11/03/18 0443 11/04/18 0728 11/04/18 1256  NA 138 139 137 138 137  --   K 4.8 4.6 5.0 4.9 5.6* 5.5*  CL 107 109 105 106 106  --   CO2 24 25 24 26 25   --   GLUCOSE 117* 86 97 88 80  --   BUN 46* 53* 62* 56* 50*  --   CREATININE 2.80* 2.91* 3.17* 2.66* 2.38*  --   CALCIUM 7.9* 7.9* 8.1* 8.8* 9.0  --    GFR: Estimated Creatinine Clearance: 13.7 mL/min (A) (by C-G formula based on SCr of 2.38 mg/dL (H)). Liver Function Tests: No results for input(s): AST, ALT, ALKPHOS, BILITOT, PROT, ALBUMIN in the last 168 hours. No results for input(s): LIPASE, AMYLASE in the last 168 hours. No results for input(s): AMMONIA in the last 168 hours. Coagulation Profile: Recent Labs  Lab 10/28/18 2049  INR 1.01   Cardiac Enzymes: No results for input(s): CKTOTAL, CKMB,  CKMBINDEX, TROPONINI in the last 168 hours. BNP (last 3 results) No results for input(s): PROBNP in the last 8760 hours. HbA1C: No results for input(s): HGBA1C in the last 72 hours. CBG: No results for input(s): GLUCAP in the last 168 hours. Lipid Profile: No results for input(s): CHOL, HDL, LDLCALC, TRIG, CHOLHDL, LDLDIRECT in the last 72 hours. Thyroid Function Tests: No results for input(s): TSH, T4TOTAL, FREET4, T3FREE, THYROIDAB in the last 72 hours. Anemia Panel: No results for input(s): VITAMINB12, FOLATE, FERRITIN, TIBC, IRON, RETICCTPCT in the last 72 hours. Urine analysis:    Component Value Date/Time   COLORURINE AMBER (A) 11/02/2018 1510   APPEARANCEUR CLOUDY (A) 11/02/2018 1510   LABSPEC 1.013 11/02/2018 1510   PHURINE 6.0 11/02/2018 1510   GLUCOSEU NEGATIVE 11/02/2018 1510   HGBUR SMALL (A) 11/02/2018 1510   BILIRUBINUR NEGATIVE 11/02/2018 1510   KETONESUR NEGATIVE 11/02/2018 1510   PROTEINUR 30 (A) 11/02/2018 1510   UROBILINOGEN 0.2 01/24/2011 1324   NITRITE NEGATIVE 11/02/2018 1510   LEUKOCYTESUR LARGE (A) 11/02/2018 1510   Recent Results (from the past 240 hour(s))  Surgical pcr screen     Status: None   Collection Time: 10/29/18  3:26 AM  Result Value Ref Range Status   MRSA, PCR NEGATIVE NEGATIVE Final   Staphylococcus aureus NEGATIVE NEGATIVE Final    Comment: (NOTE) The Xpert SA Assay (FDA approved for NASAL specimens in patients 45 years of age and older), is one component of a comprehensive surveillance program. It is not intended to diagnose infection nor to guide or monitor treatment. Performed at [facility], [place] [address] [place], [place] [address]   Urine Culture     Status: Abnormal (Preliminary result)   Collection Time: 11/03/18  3:06 PM  Result Value Ref Range Status   Specimen Description URINE, RANDOM  Final   Special Requests   Final    NONE Performed at [facility], [place] [address] [place], [place] [address]     Culture >=100,000 COLONIES/mL GRAM NEGATIVE RODS (A)  Final   Report Status PENDING  Incomplete      Radiology Studies: US Renal  Result Date: 11/02/2018 CLINICAL DATA:  84 year old female with history of colon cancer and chronic kidney disease (stage IV). EXAM: RENAL / URINARY TRACT ULTRASOUND COMPLETE COMPARISON:  None. FINDINGS: Right Kidney: Renal measurements: 11.7 x 4.6 x 4.9 cm = volume: 138.6 mL. Diffusely increased echogenicity indicative of  medical renal disease. 3.9 x 3.9 x 3.8 cm heterogeneously echogenic mass in the lower pole. In the upper pole there is a 2.0 x 1.0 x 1.4 cm heterogeneously anechoic lesion with multiple internal septations and increased through transmission. No hydronephrosis. Left Kidney: Renal measurements: 5.8 x 3.4 x 4.1 cm = volume: 41.9 mL. Atrophic kidney that is poorly visualized, but grossly unremarkable in appearance. No hydronephrosis. Bladder: Appears normal for degree of bladder distention. Other: Trace volume of ascites. IMPRESSION: 1. There are 2 indeterminate lesions in the right kidney, largest and most concerning of which is a solid-appearing mass in the lower pole measuring 3.9 x 3.9 x 3.8 cm. Further characterization with nonemergent MRI of the abdomen with and without IV gadolinium is strongly recommended in the near future to evaluate for potential neoplasm. 2. Atrophy of the left kidney. Increased echogenicity throughout the right renal parenchyma, indicative of medical renal disease. 3. No findings to suggest urinary tract obstruction. 4. Trace volume of ascites. Electronically Signed   By: Vinnie Langton M.D.   On: 11/02/2018 20:15    Scheduled Meds: . allopurinol  100 mg Oral Daily  . aspirin  81 mg Oral Daily  . carvedilol  6.25 mg Oral BID WC  . cholecalciferol  1,000 Units Oral Daily  . escitalopram  10 mg Oral Daily  . feeding supplement (ENSURE ENLIVE)  237 mL Oral TID BM  . fentaNYL  75 mcg Transdermal Q72H  . ferrous sulfate  325 mg  Oral Q breakfast  . Melatonin  6 mg Oral QHS  . multivitamin with minerals  1 tablet Oral Daily  . MUSCLE RUB  1 application Topical BID  . pantoprazole  40 mg Oral Daily  . senna  2 tablet Oral Daily  . traZODone  100 mg Oral QHS   Continuous Infusions:   LOS: 6 days   Time spent: 25 minutes.  Patrecia Pour, MD [facility] [email or web address] Password [email or web address] 11/04/2018, 2:02 PM

## 2018-11-04 NOTE — Progress Notes (Signed)
  Echocardiogram 2D Echocardiogram has been performed.  Randa Lynn Chirsty Armistead 11/04/2018, 12:33 PM

## 2018-11-04 NOTE — Plan of Care (Signed)
  Problem: Elimination: Goal: Will not experience complications related to bowel motility Outcome: Progressing Goal: Will not experience complications related to urinary retention Outcome: Completed/Met   Problem: Pain Managment: Goal: General experience of comfort will improve Outcome: Progressing   Problem: Safety: Goal: Ability to remain free from injury will improve Outcome: Progressing

## 2018-11-04 NOTE — Progress Notes (Signed)
Physical Therapy Treatment Patient Details Name: Laura Houston MRN: [id number] DOB: [birth year] Today's Date: 11/04/2018    History of Present Illness 84 year old female with history of hypertension, CAD, PAD, CHF, bradycardia, chronic kidney disease stage IV, pacemaker who was in her normal state of health at the assisted living facility and fell from wheelchair while trying to transfer. She was found to have left femoral neck fracture, now s/p left hip hemiarthroplasty.     PT Comments    Patient seen for mobility progression. Pt pleasant and eager to participate in therapy. Pt requires +2 assist for safety with functional transfers and short distance gait training. Pt able to ambulate ~5-6 ft until fatigued. Continue to progress as tolerated with anticipated d/c to SNF for further skilled PT services.     Follow Up Recommendations  SNF     Equipment Recommendations  Other (comment)(TBD at next venue)    Recommendations for Other Services       Precautions / Restrictions Precautions Precautions: Posterior Hip Precaution Comments: reviewed 3/3 precautions with Pt; pt unable to recall precautions Restrictions Weight Bearing Restrictions: Yes LLE Weight Bearing: Weight bearing as tolerated    Mobility  Bed Mobility Overal bed mobility: Needs Assistance Bed Mobility: Supine to Sit     Supine to sit: +2 for physical assistance;HOB elevated;Mod assist     General bed mobility comments: assist to bring bilat LE to EOB, scoot hips, and elevate trunk into sitting; use of rail and cues for sequencing   Transfers Overall transfer level: Needs assistance Equipment used: Rolling walker (2 wheeled) Transfers: Sit to/from [employer] Transfers Sit to Stand: +2 physical assistance;From elevated surface;Mod assist Stand pivot transfers: Mod assist;+2 physical assistance       General transfer comment: assist to power up into standing from EOB and BSc with cues for safe  hand placement and sequencing; assist to guide RW and for balance when pivoting   Ambulation/Gait Ambulation/Gait assistance: Mod assist;+2 safety/equipment Gait Distance (Feet): 5 Feet Assistive device: Rolling walker (2 wheeled) Gait Pattern/deviations: Step-through pattern;Decreased stance time - left;Decreased step length - right;Decreased step length - left;Decreased weight shift to left;Antalgic;Trunk flexed Gait velocity: decreased   General Gait Details: cues for posture, safe use of AD, and sequencing    Stairs             Wheelchair Mobility    Modified Rankin (Stroke Patients Only)       Balance Overall balance assessment: Needs assistance;History of Falls Sitting-balance support: Bilateral upper extremity supported;Feet supported Sitting balance-Leahy Scale: Poor     Standing balance support: Bilateral upper extremity supported Standing balance-Leahy Scale: Poor                              Cognition Arousal/Alertness: Awake/alert Behavior During Therapy: WFL for tasks assessed/performed Overall Cognitive Status: History of cognitive impairments - at baseline                                 General Comments: family present and report baseline      Exercises General Exercises - Lower Extremity Ankle Circles/Pumps: AROM;Both Heel Slides: AAROM;Left Hip ABduction/ADduction: AAROM;Left    General Comments General comments (skin integrity, edema, etc.): pt had BM during session and requires total A for pericare       Pertinent Vitals/Pain Pain Assessment: Faces Faces Pain Scale: Hurts little more Pain  Location: L hip Pain Descriptors / Indicators: Moaning;Grimacing;Guarding Pain Intervention(s): Limited activity within patient's tolerance;Monitored during session;Repositioned    Home Living                      Prior Function            PT Goals (current goals can now be found in the care plan section)  Progress towards PT goals: Progressing toward goals    Frequency    Min 3X/week      PT Plan Current plan remains appropriate    Co-evaluation              AM-PAC PT "6 Clicks" Mobility   Outcome Measure  Help needed turning from your back to your side while in a flat bed without using bedrails?: A Lot Help needed moving from lying on your back to sitting on the side of a flat bed without using bedrails?: A Lot Help needed moving to and from a bed to a chair (including a wheelchair)?: A Lot Help needed standing up from a chair using your arms (e.g., wheelchair or bedside chair)?: A Lot Help needed to walk in hospital room?: A Lot Help needed climbing 3-5 steps with a railing? : Total 6 Click Score: 11    End of Session Equipment Utilized During Treatment: Gait belt Activity Tolerance: Patient tolerated treatment well Patient left: in chair;with call bell/phone within reach;with chair alarm set Nurse Communication: Mobility status PT Visit Diagnosis: Unsteadiness on feet (R26.81);Other abnormalities of gait and mobility (R26.89);Repeated falls (R29.6);Muscle weakness (generalized) (M62.81);History of falling (Z91.81);Difficulty in walking, not elsewhere classified (R26.2);Pain Pain - Right/Left: Left Pain - part of body: Hip     Time: 3403-5248 PT Time Calculation (min) (ACUTE ONLY): 28 min  Charges:  $Gait Training: 8-22 mins $Therapeutic Activity: 8-22 mins                     Earney Navy, PTA Acute Rehabilitation Services Pager: [phone] Office: [phone]     Darliss Cheney 11/04/2018, 4:47 PM

## 2018-11-05 LAB — CBC WITH DIFFERENTIAL/PLATELET
Abs Immature Granulocytes: 0.02 10*3/uL (ref 0.00–0.07)
BASOS PCT: 1 %
Basophils Absolute: 0 10*3/uL (ref 0.0–0.1)
EOS ABS: 0 10*3/uL (ref 0.0–0.5)
EOS PCT: 0 %
HEMATOCRIT: 28.3 % — AB (ref 36.0–46.0)
Hemoglobin: 8.7 g/dL — ABNORMAL LOW (ref 12.0–15.0)
Immature Granulocytes: 0 %
LYMPHS ABS: 0.8 10*3/uL (ref 0.7–4.0)
Lymphocytes Relative: 15 %
MCH: 28.4 pg (ref 26.0–34.0)
MCHC: 30.7 g/dL (ref 30.0–36.0)
MCV: 92.5 fL (ref 80.0–100.0)
MONO ABS: 0.5 10*3/uL (ref 0.1–1.0)
MONOS PCT: 9 %
Neutro Abs: 4.4 10*3/uL (ref 1.7–7.7)
Neutrophils Relative %: 75 %
PLATELETS: 201 10*3/uL (ref 150–400)
RBC: 3.06 MIL/uL — ABNORMAL LOW (ref 3.87–5.11)
RDW: 14.1 % (ref 11.5–15.5)
WBC: 5.7 10*3/uL (ref 4.0–10.5)
nRBC: 0 % (ref 0.0–0.2)

## 2018-11-05 LAB — BASIC METABOLIC PANEL
ANION GAP: 9 (ref 5–15)
BUN: 48 mg/dL — AB (ref 8–23)
CALCIUM: 9.4 mg/dL (ref 8.9–10.3)
CO2: 26 mmol/L (ref 22–32)
Chloride: 101 mmol/L (ref 98–111)
Creatinine, Ser: 2.41 mg/dL — ABNORMAL HIGH (ref 0.44–1.00)
GFR calc Af Amer: 19 mL/min — ABNORMAL LOW (ref >60)
GFR, EST NON AFRICAN AMERICAN: 16 mL/min — AB (ref >60)
GLUCOSE: 81 mg/dL (ref 70–99)
POTASSIUM: 4.5 mmol/L (ref 3.5–5.1)
Sodium: 136 mmol/L (ref 135–145)

## 2018-11-05 LAB — URINE CULTURE: Culture: >=100000 — AB

## 2018-11-05 MED ORDER — FENTANYL 75 MCG/HR TD PT72: 75.0000 ug | MEDICATED_PATCH | TRANSDERMAL | 0 refills | Status: DC

## 2018-11-05 MED ORDER — OXYCODONE HCL 5 MG PO TABS: 5.0000 mg | ORAL_TABLET | ORAL | 0 refills | Status: DC | PRN

## 2018-11-05 NOTE — Discharge Summary (Signed)
Physician Discharge Summary  Laura Houston TGY:[id number] DOB: 1923-03-10 DOA: 10/28/2018  PCP: Patient, No Pcp Per  Admit date: 10/28/2018 Discharge date: 11/05/2018  Admitted From: ALF Disposition: SNF   Recommendations for Outpatient Follow-up:  1. Follow up with orthopedics, Dr. Marcelino Scot in 1 week.  2. Please obtain BMP/CBC in 1 week. 3. Recommend nonemergent MRI of the abdomen in the near future to evaluate for potential neoplasm on right renal lesions incidentally noted.  4. Please have palliative care services follow patient pending clinical progress.  Home Health: N/A Equipment/Devices: Per SNF Discharge Condition: Stable CODE STATUS: DNR Diet recommendation: Heart healthy  Brief/Interim Summary: Laura Houston is a 84 year old female with history of hypertension, CAD, PAD, CHF, bradycardia, chronic kidney disease stage IV, pacemaker who was in her normal state of health at the assisted living facility and fell from wheelchair while trying to transfer. She was found to have a displaced left femoral neck fracture, orthopedic surgery was consulted and Dr. Marcelino Scot performed left hip arthroplasty on 10/29/2018. Postoperative course was complicated by blood loss anemia requiring transfusions though blood counts have stabilized. Hyperkalemia developed transiently though resolved with restarting lasix and renal function is stable.  Discharge Diagnoses:  Principal Problem:   Closed displaced fracture of left femoral neck (HCC) Active Problems:   HYPERTENSION, MILD   Coronary atherosclerosis   Status post placement of cardiac pacemaker   Iron deficiency anemia   Gout   GERD (gastroesophageal reflux disease)   CKD (chronic kidney disease), stage IV (HCC)   Chronic systolic (congestive) heart failure (HCC)   Fall   Hyperkalemia   Protein-calorie malnutrition, severe  Fracture of the left femoral neck s/p left hemiarthroplasty on 10/29/18 by Dr. Marcelino Scot  - Management per  orthopedics: WBAT, continue ASA for DVT ppx.  - Needs continued PT at SNF, had high level of functional independence per her daughter PTA. - Follow up with orthopedics in 1 week.  Asymptomatic bacteriuria: Klebsiella on non-clean catch in pt without symptoms. Also no fever, objective exam findings, or leukocytosis to suggest UTI.  - Will defer antibiotics for now. Low threshold to start abx if develops SIRS. High risk with abx given renal insufficiency and drug allergies.  Acute blood loss anemia on chronic iron deficiency anemia: Received 2u PRBCs 11/20, 1u 11/21 with hgb up to 8.6 and stable. No bleeding. - Continue po iron. - Monitor CBC in 1 week.  Hypertension:  - Ok to restart home medications as BP is rising.    Acute kidney injury on chronic kidney disease stage IV: May be approaching stage V CKD. Would not be HD candidate due to age and frailty. No hydro on U/S. - Cr continues to improve.  - Avoid nephrotoxins as able.  Hyperkalemia: Mild in setting of constipation since resolved.  - Resolved. Monitor with restarting lasix.   Right renal lesions: Incidentally noted on U/S 2 lesions in the right kidney, largest about 3.9 x 3.9 x 3.8. -Recommend nonemergent MRI of the abdomen in the near future to evaluate for potential neoplasm  Acute on chronicdiastolicCHF: Echo is stable, slightly dilated IVC on echo.  - Continue lasix today, she is near euvolemia.   CAD: No CP. - Continue home medications   Gout: Chronic, no flares. - Continue allopurinol  GOC: Pt is DNR.  - Palliative team consulted,will follow patient as an outpatient unless if clinical deterioration happens here in the hospital.  Severe protein calorie malnutrition:  - Ensure enlive, MVI, magic cup per dietitian recommendations.  Discharge Instructions Discharge Instructions    Diet - low sodium heart healthy   Complete by:  As directed    Increase activity slowly   Complete by:  As directed       Allergies as of 11/05/2018      Reactions   Doxycycline Other (See Comments)   On MAR   Penicillins Other (See Comments)   Has patient had a PCN reaction causing immediate rash, facial/tongue/throat swelling, SOB or lightheadedness with hypotension: Unknown Has patient had a PCN reaction causing severe rash involving mucus membranes or skin necrosis: Unknown Has patient had a PCN reaction that required hospitalization: Unknown Has patient had a PCN reaction occurring within the last 10 years: Unknown If all of the above answers are "NO", then may proceed with Cephalosporin use.   Sulfonamide Derivatives       Medication List    TAKE these medications   acetaminophen 500 MG tablet Commonly known as:  TYLENOL Take 500 mg by mouth at bedtime.   acetaminophen 500 MG tablet Commonly known as:  TYLENOL Take 500 mg by mouth every 6 (six) hours as needed for mild pain, moderate pain or headache.   allopurinol 100 MG tablet Commonly known as:  ZYLOPRIM Take 100 mg by mouth daily.   amLODipine 5 MG tablet Commonly known as:  NORVASC Take 5 mg by mouth daily.   aspirin 81 MG chewable tablet Chew 81 mg by mouth daily.   carvedilol 12.5 MG tablet Commonly known as:  COREG Take 12.5 mg by mouth 2 (two) times daily with a meal.   cholecalciferol 1000 units tablet Commonly known as:  VITAMIN D Take 1,000 Units by mouth daily.   escitalopram 10 MG tablet Commonly known as:  LEXAPRO Take 10 mg by mouth daily.   fentaNYL 75 MCG/HR Commonly known as:  DURAGESIC - dosed mcg/hr Place 1 patch (75 mcg total) onto the skin every 3 (three) days.   ferrous sulfate 325 (65 FE) MG EC tablet Take 325 mg by mouth daily with breakfast.   furosemide 40 MG tablet Commonly known as:  LASIX Take 40 mg by mouth at bedtime.   hyoscyamine 0.125 MG tablet Commonly known as:  LEVSIN, ANASPAZ Take 0.125 mg by mouth every 4 (four) hours as needed (excess secretions).   ICY HOT EX Apply 1  application topically 2 (two) times daily.   isosorbide mononitrate 60 MG 24 hr tablet Commonly known as:  IMDUR Take 60 mg by mouth daily.   lactulose 10 GM/15ML solution Commonly known as:  CHRONULAC Take 30 g by mouth 2 (two) times daily as needed for mild constipation.   Melatonin 5 MG Tabs Take 5 mg by mouth at bedtime.   omeprazole 20 MG capsule Commonly known as:  PRILOSEC Take 20 mg by mouth daily.   oxyCODONE 5 MG immediate release tablet Commonly known as:  Oxy IR/ROXICODONE Take 1 tablet (5 mg total) by mouth every 4 (four) hours as needed for moderate pain or severe pain.   polyethylene glycol packet Commonly known as:  MIRALAX / GLYCOLAX Take 17 g by mouth every other day.   senna 8.6 MG tablet Commonly known as:  SENOKOT Take 2 tablets by mouth daily.   traZODone 100 MG tablet Commonly known as:  DESYREL Take 100 mg by mouth at bedtime.      Follow-up Information    Altamese Countryside, MD. Schedule an appointment as soon as possible for a visit in 1 week(s).  Specialty:  Orthopedic Surgery Contact information: [place] [address] [phone]          Allergies  Allergen Reactions  . Doxycycline Other (See Comments)    On MAR  . Penicillins Other (See Comments)    Has patient had a PCN reaction causing immediate rash, facial/tongue/throat swelling, SOB or lightheadedness with hypotension: Unknown Has patient had a PCN reaction causing severe rash involving mucus membranes or skin necrosis: Unknown Has patient had a PCN reaction that required hospitalization: Unknown Has patient had a PCN reaction occurring within the last 10 years: Unknown If all of the above answers are "NO", then may proceed with Cephalosporin use.   . Sulfonamide Derivatives     Consultations:  Orthopedics  Procedures/Studies: Dg Chest 1 View  Result Date: 10/28/2018 CLINICAL DATA:  Fall from wheelchair. EXAM: CHEST  1 VIEW COMPARISON:  January 24, 2011 FINDINGS: Continued elevation the right hemidiaphragm. A new pacemaker terminates with leads in the right atrium and right ventricle. Cardiomegaly. The hila and mediastinum are normal. No pulmonary nodules or masses. IMPRESSION: No active disease. Electronically Signed   By: Dorise Bullion III M.D   On: 10/28/2018 22:52   US Renal  Result Date: 11/02/2018 CLINICAL DATA:  84 year old female with history of colon cancer and chronic kidney disease (stage IV). EXAM: RENAL / URINARY TRACT ULTRASOUND COMPLETE COMPARISON:  None. FINDINGS: Right Kidney: Renal measurements: 11.7 x 4.6 x 4.9 cm = volume: 138.6 mL. Diffusely increased echogenicity indicative of medical renal disease. 3.9 x 3.9 x 3.8 cm heterogeneously echogenic mass in the lower pole. In the upper pole there is a 2.0 x 1.0 x 1.4 cm heterogeneously anechoic lesion with multiple internal septations and increased through transmission. No hydronephrosis. Left Kidney: Renal measurements: 5.8 x 3.4 x 4.1 cm = volume: 41.9 mL. Atrophic kidney that is poorly visualized, but grossly unremarkable in appearance. No hydronephrosis. Bladder: Appears normal for degree of bladder distention. Other: Trace volume of ascites. IMPRESSION: 1. There are 2 indeterminate lesions in the right kidney, largest and most concerning of which is a solid-appearing mass in the lower pole measuring 3.9 x 3.9 x 3.8 cm. Further characterization with nonemergent MRI of the abdomen with and without IV gadolinium is strongly recommended in the near future to evaluate for potential neoplasm. 2. Atrophy of the left kidney. Increased echogenicity throughout the right renal parenchyma, indicative of medical renal disease. 3. No findings to suggest urinary tract obstruction. 4. Trace volume of ascites. Electronically Signed   By: Vinnie Langton M.D.   On: 11/02/2018 20:15   Dg Chest Port 1 View  Result Date: 11/02/2018 CLINICAL DATA:  Dyspnea EXAM: PORTABLE CHEST 1 VIEW  COMPARISON:  Radiographs 10/28/2018 and 01/24/2011 FINDINGS: 0821 hours. The left subclavian pacemaker leads appear unchanged in the right atrium and right ventricle. There is stable cardiomegaly and diffuse aortic atherosclerosis. There is chronic elevation of the right hemidiaphragm with a new small right pleural effusion and mildly increased atelectasis at the right lung base. The left lung is clear. There is no pneumothorax. Advanced glenohumeral degenerative changes are present bilaterally. No acute osseous findings are seen. IMPRESSION: 1. New small right pleural effusion with mildly increased adjacent right basilar atelectasis. No edema or confluent airspace opacity. 2. Stable cardiomegaly and aortic atherosclerosis. Electronically Signed   By: Richardean Sale M.D.   On: 11/02/2018 08:46   Dg Hip Unilat With Pelvis 2-3 Views Left  Result Date: 10/29/2018 CLINICAL DATA:  Post  left hip restricted 84 year old with hip fracture post hip replacement. EXAM: DG HIP (WITH OR WITHOUT PELVIS) 2-3V LEFT COMPARISON:  10/28/2018. FINDINGS: Lateral view slightly limited by technique. Left total hip replacement appears in satisfactory position without complication noted on frontal view. IMPRESSION: Post total left hip replacement. Electronically Signed   By: Genia Del M.D.   On: 10/29/2018 19:22   Dg Hip Unilat With Pelvis 2-3 Views Left  Result Date: 10/28/2018 CLINICAL DATA:  Fall. EXAM: DG HIP (WITH OR WITHOUT PELVIS) 2-3V LEFT COMPARISON:  None. FINDINGS: There is a fracture of the proximal femur involving the proximal neck. No dislocation. IMPRESSION: Fracture through the proximal left femoral neck with mild displacement. No dislocation. Electronically Signed   By: Dorise Bullion III M.D   On: 10/28/2018 22:51      Echocardiogram 11/04/2018: - Left ventricle: The cavity size was normal. Systolic function was normal. The estimated ejection fraction was in the range of 55% to 60%. Abnormal  septal motion, likely secondary to conduction. Wall motion was normal; there were no regional wall motion abnormalities. Doppler parameters are consistent with abnormal left ventricular relaxation (grade 1 diastolic dysfunction). - Aortic valve: Valve mobility was mildly restricted. Transvalvular velocity was within the normal range. There was no stenosis. There was moderate regurgitation. - Aorta: Aortic root dimension: 43 mm (ED). - Aortic root: The aortic root was mildly dilated. - Mitral valve: There was mild regurgitation. - Left atrium: The atrium was moderately dilated. - Right ventricle: The cavity size was normal. Wall thickness was normal. Pacer wire or catheter noted in right ventricle. Systolic function was normal. RV systolic pressure (S, est): 39 mm Hg. - Right atrium: The atrium was mildly dilated. Central venous pressure (est): 10 mm Hg. - Tricuspid valve: There was mild regurgitation. - Pulmonary arteries: Systolic pressure was mildly to moderately increased. - Inferior vena cava: The vessel was dilated. The respirophasic diameter changes were in the normal range (= 50%). - Pericardium, extracardiac: There was no pericardial effusion.  Impressions: - Moderate aortic valve regurgitation with dilated sinuses of Valsalva (43 mm in approximate dimension), normal LV function, normal RV function. Mild MR, Mild TR, RVSP 39 mmHg, RA pressure estimate 10 mmHg. Moderate LA enlargement.  Subjective: Tired of being in chair for past couple hours. Pain is controlled. Was talking out of her head last night but back to baseline this morning. No agitation. Slept well. Working with PT. No bleeding.  Discharge Exam: Vitals:   11/05/18 0356 11/05/18 0917  BP: (!) 146/76 (!) 151/60  Pulse: 73 60  Resp: 19 19  Temp: 98.2 F (36.8 C) 98.9 F (37.2 C)  SpO2: 98% 93%   General: Elderly female in no distress Eyes: Arcus senilis, reactive  pupils. Cardiovascular: RRR, S1/S2 +, no rubs, no gallops Respiratory: CTA bilaterally, no wheezing, no rhonchi Abdominal: Soft, NT, ND, bowel sounds + Extremities: Left thigh compartment soft, nontender, bandage c/d/i. Sensorimotor function normal distally, brisk cap refill.   Labs: BNP (last 3 results) Recent Labs    10/29/18 0240  BNP 096.0*   Basic Metabolic Panel: Recent Labs  Lab 11/01/18 0207 11/02/18 0259 11/03/18 0443 11/04/18 0728 11/04/18 1256 11/05/18 0359  NA 139 137 138 137  --  136  K 4.6 5.0 4.9 5.6* 5.5* 4.5  CL 109 105 106 106  --  101  CO2 25 24 26 25   --  26  GLUCOSE 86 97 88 80  --  81  BUN 53* 62* 56*  50*  --  48*  CREATININE 2.91* 3.17* 2.66* 2.38*  --  2.41*  CALCIUM 7.9* 8.1* 8.8* 9.0  --  9.4   Liver Function Tests: No results for input(s): AST, ALT, ALKPHOS, BILITOT, PROT, ALBUMIN in the last 168 hours. No results for input(s): LIPASE, AMYLASE in the last 168 hours. No results for input(s): AMMONIA in the last 168 hours. CBC: Recent Labs  Lab 11/01/18 0207 11/02/18 0259 11/03/18 0443 11/04/18 0728 11/05/18 0359  WBC 5.5 5.5 4.8 4.9 5.7  NEUTROABS  --  4.2 3.6 3.9 4.4  HGB 7.6* 7.6* 8.6* 8.7* 8.7*  HCT 24.3* 24.8* 27.5* 29.0* 28.3*  MCV 90.7 91.9 91.7 93.2 92.5  PLT 109* 146* 155 142* 201   Cardiac Enzymes: No results for input(s): CKTOTAL, CKMB, CKMBINDEX, TROPONINI in the last 168 hours. BNP: Invalid input(s): POCBNP CBG: No results for input(s): GLUCAP in the last 168 hours. D-Dimer No results for input(s): DDIMER in the last 72 hours. Hgb A1c No results for input(s): HGBA1C in the last 72 hours. Lipid Profile No results for input(s): CHOL, HDL, LDLCALC, TRIG, CHOLHDL, LDLDIRECT in the last 72 hours. Thyroid function studies No results for input(s): TSH, T4TOTAL, T3FREE, THYROIDAB in the last 72 hours.  Invalid input(s): FREET3 Anemia work up No results for input(s): VITAMINB12, FOLATE, FERRITIN, TIBC, IRON, RETICCTPCT in  the last 72 hours. Urinalysis    Component Value Date/Time   COLORURINE AMBER (A) 11/02/2018 1510   APPEARANCEUR CLOUDY (A) 11/02/2018 1510   LABSPEC 1.013 11/02/2018 1510   PHURINE 6.0 11/02/2018 1510   GLUCOSEU NEGATIVE 11/02/2018 1510   HGBUR SMALL (A) 11/02/2018 1510   BILIRUBINUR NEGATIVE 11/02/2018 1510   KETONESUR NEGATIVE 11/02/2018 1510   PROTEINUR 30 (A) 11/02/2018 1510   UROBILINOGEN 0.2 01/24/2011 1324   NITRITE NEGATIVE 11/02/2018 1510   LEUKOCYTESUR LARGE (A) 11/02/2018 1510    Microbiology Recent Results (from the past 240 hour(s))  Surgical pcr screen     Status: None   Collection Time: 10/29/18  3:26 AM  Result Value Ref Range Status   MRSA, PCR NEGATIVE NEGATIVE Final   Staphylococcus aureus NEGATIVE NEGATIVE Final    Comment: (NOTE) The Xpert SA Assay (FDA approved for NASAL specimens in patients 27 years of age and older), is one component of a comprehensive surveillance program. It is not intended to diagnose infection nor to guide or monitor treatment. Performed at [facility], [place] [address], [place], [place] [address]   Urine Culture     Status: Abnormal   Collection Time: 11/03/18  3:06 PM  Result Value Ref Range Status   Specimen Description URINE, RANDOM  Final   Special Requests   Final    NONE Performed at [facility], [place] [address], [place], [place] [address]    Culture >=100,000 COLONIES/mL KLEBSIELLA PNEUMONIAE (A)  Final   Report Status 11/05/2018 FINAL  Final   Organism ID, Bacteria KLEBSIELLA PNEUMONIAE (A)  Final      Susceptibility   Klebsiella pneumoniae - MIC*    AMPICILLIN >=32 RESISTANT Resistant     CEFAZOLIN <=4 SENSITIVE Sensitive     CEFTRIAXONE <=1 SENSITIVE Sensitive     CIPROFLOXACIN <=0.25 SENSITIVE Sensitive     GENTAMICIN <=1 SENSITIVE Sensitive     IMIPENEM <=0.25 SENSITIVE Sensitive     NITROFURANTOIN 64 INTERMEDIATE Intermediate     TRIMETH/SULFA <=20 SENSITIVE Sensitive     AMPICILLIN/SULBACTAM  8 SENSITIVE Sensitive     PIP/TAZO <=4  SENSITIVE Sensitive     Extended ESBL NEGATIVE Sensitive     * >=100,000 COLONIES/mL KLEBSIELLA PNEUMONIAE    Time coordinating discharge: Approximately 40 minutes  Patrecia Pour, MD  [facility] 11/05/2018, 11:52 AM Pager [phone]

## 2018-11-05 NOTE — Progress Notes (Signed)
Nutrition Follow-up  DOCUMENTATION CODES:   Severe malnutrition in context of chronic illness  INTERVENTION:    Continue Ensure Enlive po TID, each supplement provides 350 kcal and 20 grams of protein  Continue Magic cup TID with meals, each supplement provides 290 kcal and 9 grams of protein  Continue MVI daily   NUTRITION DIAGNOSIS:   Severe Malnutrition related to chronic illness(COPD, CHF, CKD 4) as evidenced by severe fat depletion, severe muscle depletion.  Ongoing  GOAL:   Patient will meet greater than or equal to 90% of their needs  Progressing  MONITOR:   PO intake, Supplement acceptance  ASSESSMENT:   Pt with PMH of HTN, COPD, PAD,CHF, GERD,CKD Stage 4, and pacemaker living in SNF. Admitted due to fall from wheelchair while playing bingo fracturing left femur.  Patient is consuming 25-75% of meals per flow sheet documentation. Patient thinks she is doing better. She is being offered Ensure Enlive TID, drinks 1-2 per day usually. She likes the magic cup supplements, eats ~half at each meal.  Labs and medications reviewed.    Diet Order:   Diet Order            Diet Heart Room service appropriate? Yes; Fluid consistency: Thin  Diet effective now              EDUCATION NEEDS:   No education needs have been identified at this time  Skin:  Skin Assessment: Reviewed RN Assessment  Last BM:  11/25  Height:   Ht Readings from Last 1 Encounters:  10/31/18 5\' 3"  (1.6 m)    Weight:   Wt Readings from Last 1 Encounters:  10/31/18 68.2 kg    Ideal Body Weight:  56.9 kg  BMI:  Body mass index is 26.64 kg/m.  Estimated Nutritional Needs:   Kcal:  1700-1900  Protein:  90-100 grams  Fluid:  >1.7L    Molli Barrows, RD, LDN, [place] Pager [phone] After Hours Pager [phone]

## 2018-11-05 NOTE — Progress Notes (Signed)
Patient will DC to: [facility] Anticipated DC date: 11/05/18 Family notified: Vaughan Basta Transport by: Corey Harold  Per MD patient ready for DC to [facility], patient, patient's family, and facility notified of DC. Discharge Summary sent to facility. RN given number for report [phone]. DC packet on chart. Ambulance transport requested for patient.  CSW signing off.  [place], [place]

## 2018-11-05 NOTE — Progress Notes (Signed)
Physical Therapy Treatment Patient Details Name: Laura Houston MRN: [id number] DOB: March 08, 1923 Today's Date: 11/05/2018    History of Present Illness 84 year old female with history of hypertension, CAD, PAD, CHF, bradycardia, chronic kidney disease stage IV, pacemaker who was in her normal state of health at the assisted living facility and fell from wheelchair while trying to transfer. She was found to have left femoral neck fracture, now s/p left hip hemiarthroplasty.     PT Comments    Pt needs a lot of encouragement to mobilize but is glad to be up once OOB. C/O buttocks pain in chair though. Mod A needed for sit to stand and transfer to [facility]. +2 for safety with short distance ambulation, pt not agreeable to going any further today. PT will continue to follow.    Follow Up Recommendations  SNF     Equipment Recommendations  Other (comment)(TBD at next venue)    Recommendations for Other Services       Precautions / Restrictions Precautions Precautions: Posterior Hip Precaution Comments: pt unable to recall precautions Restrictions Weight Bearing Restrictions: Yes LLE Weight Bearing: Weight bearing as tolerated    Mobility  Bed Mobility Overal bed mobility: Needs Assistance Bed Mobility: Supine to Sit     Supine to sit: +2 for physical assistance;Mod assist     General bed mobility comments: assist to bring bilat LE to EOB, scoot hips, and elevate trunk into sitting; use of rail and cues for sequencing. Pt able to grasp rail and assist with righting  Transfers Overall transfer level: Needs assistance Equipment used: Rolling walker (2 wheeled) Transfers: Sit to/from [facility] Sit to Stand: Mod assist;+2 safety/equipment Stand pivot transfers: +2 physical assistance;Min assist       General transfer comment: mod A for power up but pt stepped feet to [facility] with min A at RW and at hips. vc's for stepping feet  Ambulation/Gait Ambulation/Gait  assistance: Mod assist;+2 safety/equipment Gait Distance (Feet): 4 Feet Assistive device: Rolling walker (2 wheeled) Gait Pattern/deviations: Step-through pattern;Decreased stance time - left;Decreased step length - right;Decreased step length - left;Decreased weight shift to left;Antalgic;Trunk flexed Gait velocity: decreased Gait velocity interpretation: <1.31 ft/sec, indicative of household ambulator General Gait Details: cues for posture, safe use of AD, and sequencing. Pt needed facilitation of RW fwd because she would step feet under it without sliding it out   Stairs             Wheelchair Mobility    Modified Rankin (Stroke Patients Only)       Balance Overall balance assessment: Needs assistance;History of Falls Sitting-balance support: Bilateral upper extremity supported;Feet supported Sitting balance-Leahy Scale: Fair Sitting balance - Comments: able to sit EOB without support Postural control: Posterior lean Standing balance support: Bilateral upper extremity supported Standing balance-Leahy Scale: Poor Standing balance comment: reliant on UE                            Cognition Arousal/Alertness: Awake/alert Behavior During Therapy: WFL for tasks assessed/performed Overall Cognitive Status: History of cognitive impairments - at baseline                                        Exercises General Exercises - Lower Extremity Ankle Circles/Pumps: AROM;Both Quad Sets: AROM;Both;10 reps;Supine Long Arc Quad: AROM;Both;10 reps;Seated Hip ABduction/ADduction: AAROM;Left;5 reps;Supine    General Comments  Pertinent Vitals/Pain Pain Assessment: Faces Faces Pain Scale: Hurts even more Pain Location: L hip Pain Descriptors / Indicators: Moaning;Grimacing;Guarding Pain Intervention(s): Limited activity within patient's tolerance;Monitored during session;Repositioned    Home Living                      Prior Function             PT Goals (current goals can now be found in the care plan section) Acute Rehab PT Goals Patient Stated Goal: none stated PT Goal Formulation: With patient Time For Goal Achievement: 11/13/18 Potential to Achieve Goals: Fair Progress towards PT goals: Progressing toward goals    Frequency    Min 3X/week      PT Plan Current plan remains appropriate    Co-evaluation              AM-PAC PT "6 Clicks" Mobility   Outcome Measure  Help needed turning from your back to your side while in a flat bed without using bedrails?: A Lot Help needed moving from lying on your back to sitting on the side of a flat bed without using bedrails?: A Lot Help needed moving to and from a bed to a chair (including a wheelchair)?: A Lot Help needed standing up from a chair using your arms (e.g., wheelchair or bedside chair)?: A Lot Help needed to walk in hospital room?: A Lot Help needed climbing 3-5 steps with a railing? : Total 6 Click Score: 11    End of Session Equipment Utilized During Treatment: Gait belt Activity Tolerance: Patient tolerated treatment well Patient left: in chair;with call bell/phone within reach;with chair alarm set Nurse Communication: Mobility status PT Visit Diagnosis: Unsteadiness on feet (R26.81);Other abnormalities of gait and mobility (R26.89);Repeated falls (R29.6);Muscle weakness (generalized) (M62.81);History of falling (Z91.81);Difficulty in walking, not elsewhere classified (R26.2);Pain Pain - Right/Left: Left Pain - part of body: Hip     Time: 1026-1050 PT Time Calculation (min) (ACUTE ONLY): 24 min  Charges:  $Gait Training: 8-22 mins $Therapeutic Exercise: 8-22 mins                     Leighton Roach, [place]  Pager [phone] Office [place] 11/05/2018, 12:15 PM

## 2018-11-05 NOTE — Clinical Social Work Placement (Signed)
   CLINICAL SOCIAL WORK PLACEMENT  NOTE  Date:  11/05/2018  Patient Details  Name: Laura Houston MRN: [id number] Date of Birth: [birth year]  Clinical Social Work is seeking post-discharge placement for this patient at the [place] level of care (*CSW will initial, date and re-position this form in  chart as items are completed):      Patient/family provided with [facility] Work Department's list of facilities offering this level of care within the geographic area requested by the patient (or if unable, by the patient's family).  Yes   Patient/family informed of their freedom to choose among providers that offer the needed level of care, that participate in Medicare, Medicaid or managed care program needed by the patient, have an available bed and are willing to accept the patient.      Patient/family informed of [facility]'s ownership interest in [facility] and [facility], as well as of the fact that they are under no obligation to receive care at these facilities.  PASRR submitted to EDS on       PASRR number received on 11/01/18     Existing PASRR number confirmed on       FL2 transmitted to all facilities in geographic area requested by pt/family on 11/01/18     FL2 transmitted to all facilities within larger geographic area on       Patient informed that his/her managed care company has contracts with or will negotiate with certain facilities, including the following:        Yes   Patient/family informed of bed offers received.  Patient chooses bed at [facility]     Physician recommends and patient chooses bed at      Patient to be transferred to [facility] on 11/05/18.  Patient to be transferred to facility by [facility]     Patient family notified on 11/05/18 of transfer.  Name of family member notified:  Vaughan Basta     PHYSICIAN Please sign DNR     Additional Comment:     _______________________________________________ Alberteen Sam, LCSW 11/05/2018, 12:24 PM

## 2018-11-05 NOTE — Progress Notes (Signed)
Pt not in distress, HPOA informed of discharge to facility and transported belongings to facility, Gave report to Angelica, all questions and concerns addressed, Pt transported to facility via [place].

## 2018-11-05 NOTE — Care Management Important Message (Signed)
Important Message  Patient Details  Name: Laura Houston MRN: [id number] Date of Birth: [birth year]   Medicare Important Message Given:  Yes    [name] P Ruhi Kopke 11/05/2018, 4:01 PM

## 2018-11-05 NOTE — Plan of Care (Signed)

## 2018-12-03 ENCOUNTER — Emergency Department (HOSPITAL_COMMUNITY): Payer: Medicare Other

## 2018-12-03 ENCOUNTER — Other Ambulatory Visit: Payer: Self-pay

## 2018-12-03 ENCOUNTER — Encounter (HOSPITAL_COMMUNITY): Payer: Self-pay | Admitting: Emergency Medicine

## 2018-12-03 ENCOUNTER — Emergency Department (HOSPITAL_COMMUNITY)
Admission: EM | Admit: 2018-12-03 | Discharge: 2018-12-03 | Disposition: A | Discharge status: Home / Self Care | Payer: Medicare Other | Attending: Emergency Medicine | Admitting: Emergency Medicine

## 2018-12-03 DIAGNOSIS — Z79899 Other long term (current) drug therapy: Secondary | ICD-10-CM | POA: Diagnosis not present

## 2018-12-03 DIAGNOSIS — Z7982 Long term (current) use of aspirin: Secondary | ICD-10-CM | POA: Insufficient documentation

## 2018-12-03 DIAGNOSIS — I129 Hypertensive chronic kidney disease with stage 1 through stage 4 chronic kidney disease, or unspecified chronic kidney disease: Secondary | ICD-10-CM | POA: Insufficient documentation

## 2018-12-03 DIAGNOSIS — N183 Chronic kidney disease, stage 3 (moderate): Secondary | ICD-10-CM | POA: Diagnosis not present

## 2018-12-03 DIAGNOSIS — Z4802 Encounter for removal of sutures: Secondary | ICD-10-CM | POA: Diagnosis not present

## 2018-12-03 DIAGNOSIS — M25552 Pain in left hip: Secondary | ICD-10-CM

## 2018-12-03 DIAGNOSIS — W19XXXA Unspecified fall, initial encounter: Secondary | ICD-10-CM

## 2018-12-03 NOTE — ED Provider Notes (Signed)
[facility] EMERGENCY DEPARTMENT Provider Note   CSN: [id number] Arrival date & time: 12/03/18  0807     History   Chief Complaint Chief Complaint  Patient presents with  . Fall    HPI Laura Houston is a 84 y.o. female.  HPI Patient left hip replacement 1 month ago.  She reports she still has difficulty walking and balance problems.  She needed to go to the bathroom and was seated at her bedside.  She was working with an aide who thought she would be okay to walk.  The patient reports she knew that she was unsteady and did not want to do it but ended up trying to stand and slid from her seated position at the bedside onto her buttocks.  She did not hit her head or sustain any pain or injury to the upper extremities.  Reports she is concerned about her left hip.  She also has had a left knee replacement.  She reports the knee is uncomfortable much of the time but she is also worried about this having gotten injured during the fall.  Her daughter also is concerned because she was never taken to follow-up for the hip repair and her sutures have been in place for about a month now. Past Medical History:  Diagnosis Date  . Bradycardia   . CAD (coronary artery disease)   . Chronic systolic (congestive) heart failure ([place])   . CKD (chronic kidney disease), stage IV ([place])   . Colon cancer [facility])    s/p of colectomy  . GERD (gastroesophageal reflux disease)   . Gout   . Hospice care   . Iron deficiency anemia   . Status post placement of cardiac pacemaker     Patient Active Problem List   Diagnosis Date Noted  . Fall 10/29/2018  . Hyperkalemia 10/29/2018  . Closed displaced fracture of left femoral neck ([place]) 10/29/2018  . Protein-calorie malnutrition, severe 10/29/2018  . Status post placement of cardiac pacemaker   . Iron deficiency anemia   . Hospice care   . Gout   . GERD (gastroesophageal reflux disease)   . CKD (chronic kidney disease), stage IV ([place])    . Chronic systolic (congestive) heart failure ([place])   . CAD (coronary artery disease)   . Bradycardia   . HYPERTENSION, MILD 08/26/2010  . BRADYCARDIA 08/26/2010  . EDEMA 08/26/2010  . Coronary atherosclerosis 08/24/2010    Past Surgical History:  Procedure Laterality Date  . ABDOMINAL HYSTERECTOMY    . HIP ARTHROPLASTY Left 10/29/2018   Procedure: LEFT HIP HEMIARTHROPLASTY;  Surgeon: Altamese Sharpsburg, MD;  Location: [facility];  Service: Orthopedics;  Laterality: Left;  . PARTIAL COLECTOMY       OB History   No obstetric history on file.      Home Medications    Prior to Admission medications   Medication Sig Start Date End Date Taking? Authorizing Provider  acetaminophen (TYLENOL) 500 MG tablet Take 500 mg by mouth every 6 (six) hours as needed for mild pain, moderate pain or headache.    [provider]  acetaminophen (TYLENOL) 500 MG tablet Take 500 mg by mouth at bedtime.    [provider]  allopurinol (ZYLOPRIM) 100 MG tablet Take 100 mg by mouth daily.    [provider]  amLODipine (NORVASC) 5 MG tablet Take 5 mg by mouth daily.    [provider]  aspirin 81 MG chewable tablet Chew 81 mg by mouth daily.  [provider]  carvedilol (COREG) 12.5 MG tablet Take 12.5 mg by mouth 2 (two) times daily with a meal.    [provider]  cholecalciferol (VITAMIN D) 1000 units tablet Take 1,000 Units by mouth daily.    [provider]  escitalopram (LEXAPRO) 10 MG tablet Take 10 mg by mouth daily.    [provider]  fentaNYL (DURAGESIC - DOSED MCG/HR) 75 MCG/HR Place 1 patch (75 mcg total) onto the skin every 3 (three) days. 11/05/18   Patrecia Pour, MD  ferrous sulfate 325 (65 FE) MG EC tablet Take 325 mg by mouth daily with breakfast.    [provider]  furosemide (LASIX) 40 MG tablet Take 40 mg by mouth at bedtime.    [provider]  hyoscyamine (LEVSIN, ANASPAZ) 0.125 MG tablet Take  0.125 mg by mouth every 4 (four) hours as needed (excess secretions).    [provider]  isosorbide mononitrate (IMDUR) 60 MG 24 hr tablet Take 60 mg by mouth daily.    [provider]  lactulose (CHRONULAC) 10 GM/15ML solution Take 30 g by mouth 2 (two) times daily as needed for mild constipation.    [provider]  Melatonin 5 MG TABS Take 5 mg by mouth at bedtime.    [provider]  Menthol, Topical Analgesic, (ICY HOT EX) Apply 1 application topically 2 (two) times daily.    [provider]  omeprazole (PRILOSEC) 20 MG capsule Take 20 mg by mouth daily.    [provider]  oxyCODONE (OXY IR/ROXICODONE) 5 MG immediate release tablet Take 1 tablet (5 mg total) by mouth every 4 (four) hours as needed for moderate pain or severe pain. 11/05/18   Patrecia Pour, MD  polyethylene glycol (MIRALAX / Floria Raveling) packet Take 17 g by mouth every other day.    [provider]  senna (SENOKOT) 8.6 MG tablet Take 2 tablets by mouth daily.     [provider]  traZODone (DESYREL) 100 MG tablet Take 100 mg by mouth at bedtime.    [provider]    Family History Family History  Problem Relation Age of Onset  . AAA (abdominal aortic aneurysm) Mother     Social History Social History   Tobacco Use  . Smoking status: Never Smoker  . Smokeless tobacco: Never Used  Substance Use Topics  . Alcohol use: Never    Frequency: Never  . Drug use: Never     Allergies   Doxycycline; Penicillins; and Sulfonamide derivatives   Review of Systems Review of Systems 10 Systems reviewed and are negative for acute change except as noted in the HPI.  Physical Exam Updated Vital Signs Ht 5\' 6"  (1.676 m)   Wt 59.8 kg   BMI 21.27 kg/m   Physical Exam Constitutional:      Comments: Patient is alert and in excellent condition for age.  No distress.  HENT:     Head: Normocephalic and atraumatic.     Nose: Nose normal.  Eyes:      Extraocular Movements: Extraocular movements intact.  Cardiovascular:     Rate and Rhythm: Normal rate.     Heart sounds: Murmur present.  Pulmonary:     Effort: Pulmonary effort is normal.     Breath sounds: Normal breath sounds.  Chest:     Chest wall: No tenderness.  Abdominal:     General: There is no distension.     Palpations: Abdomen is soft.  Tenderness: There is no abdominal tenderness. There is no guarding.  Musculoskeletal:     Comments: Patient's left hip incision is completely healed.  It is clean and dry without erythema.  Sutures do remain without any surrounding inflammatory response.  Bilateral lower extremities are symmetric.  Dorsalis pedis pulses are 2+.  Patient has some small amount of arthritic changes around the ankles but no deformity and no pain with flexion dorsiflexion.  Calves soft nontender.  Patient's left knee is enlarged consistent with arthritis and surgical replacement but no erythema or gross effusion.  Patient can bend at the knee without significant pain.  Patient can flex at the hip without significant pain.  Skin:    General: Skin is warm and dry.  Neurological:     General: No focal deficit present.     Mental Status: She is oriented to person, place, and time.     Coordination: Coordination normal.  Psychiatric:        Mood and Affect: Mood normal.      ED Treatments / Results  Labs (all labs ordered are listed, but only abnormal results are displayed) Labs Reviewed - No data to display  EKG None  Radiology No results found.  Procedures .Suture Removal Date/Time: 12/03/2018 11:16 AM Performed by: Charlesetta Shanks, MD Authorized by: Charlesetta Shanks, MD   Consent:    Consent obtained:  Verbal   Consent given by:  Patient   Risks discussed:  Bleeding and pain Location:    Location:  Lower extremity   Lower extremity location:  Hip   Hip location:  L hip Procedure details:    Wound appearance:  No signs of infection and  clean   Number of sutures removed:  12 Comments:     Patient's wound is well-healed.  A couple of sutures were slightly buried due to complete wound healing.  All were removed without any significant difficulty.   (including critical care time)  Medications Ordered in ED Medications - No data to display   Initial Impression / Assessment and Plan / ED Course  I have reviewed the triage vital signs and the nursing notes.  Pertinent labs & imaging results that were available during my care of the patient were reviewed by me and considered in my medical decision making (see chart for details).     Patient slid to the floor from a standing position.  As she had concern for her left hip.  She has had hip replacement approximately months ago.  The wound is completely healed.  Unknown reason patient has not yet had follow-up and sutures needed to be removed.  This was done.  No evidence of acute injury from the patient's fall.  Patient will be discharged in satisfactory condition.  Final Clinical Impressions(s) / ED Diagnoses   Final diagnoses:  None    ED Discharge Orders    None       Charlesetta Shanks, MD 12/03/18 1118

## 2018-12-03 NOTE — ED Triage Notes (Signed)
Per [facility]- pt picked up from [facility] this morning due to witnessed fall.  Reports Pt slide out of chair onto bottom. Pt has hx l hip pain due and hip replacement in Nov. Stitches in place.  Denies LOC, n/v/d, or head injury.  Daughter wants mom hip reevaluated.

## 2018-12-03 NOTE — ED Notes (Signed)
Pt transported to xray 

## 2018-12-03 NOTE — Discharge Instructions (Addendum)
1.  Follow-up with your orthopedic doctor for recheck as soon as possible. 2.  Return to the emergency department if you have worsening or changing pain.

## 2019-02-03 ENCOUNTER — Emergency Department (HOSPITAL_COMMUNITY)
Admission: EM | Admit: 2019-02-03 | Discharge: 2019-02-03 | Disposition: A | Discharge status: Home / Self Care | Payer: Medicare Other | Attending: Emergency Medicine | Admitting: Emergency Medicine

## 2019-02-03 ENCOUNTER — Emergency Department (HOSPITAL_COMMUNITY): Payer: Medicare Other

## 2019-02-03 DIAGNOSIS — Y999 Unspecified external cause status: Secondary | ICD-10-CM | POA: Insufficient documentation

## 2019-02-03 DIAGNOSIS — Z79899 Other long term (current) drug therapy: Secondary | ICD-10-CM | POA: Diagnosis not present

## 2019-02-03 DIAGNOSIS — I2510 Atherosclerotic heart disease of native coronary artery without angina pectoris: Secondary | ICD-10-CM | POA: Diagnosis not present

## 2019-02-03 DIAGNOSIS — I5022 Chronic systolic (congestive) heart failure: Secondary | ICD-10-CM | POA: Diagnosis not present

## 2019-02-03 DIAGNOSIS — Y92122 Bedroom in nursing home as the place of occurrence of the external cause: Secondary | ICD-10-CM | POA: Insufficient documentation

## 2019-02-03 DIAGNOSIS — Y9389 Activity, other specified: Secondary | ICD-10-CM | POA: Diagnosis not present

## 2019-02-03 DIAGNOSIS — M4802 Spinal stenosis, cervical region: Secondary | ICD-10-CM | POA: Insufficient documentation

## 2019-02-03 DIAGNOSIS — S0083XA Contusion of other part of head, initial encounter: Secondary | ICD-10-CM | POA: Diagnosis not present

## 2019-02-03 DIAGNOSIS — Z7982 Long term (current) use of aspirin: Secondary | ICD-10-CM | POA: Insufficient documentation

## 2019-02-03 DIAGNOSIS — W19XXXA Unspecified fall, initial encounter: Secondary | ICD-10-CM

## 2019-02-03 DIAGNOSIS — W06XXXA Fall from bed, initial encounter: Secondary | ICD-10-CM | POA: Diagnosis not present

## 2019-02-03 DIAGNOSIS — N184 Chronic kidney disease, stage 4 (severe): Secondary | ICD-10-CM | POA: Diagnosis not present

## 2019-02-03 DIAGNOSIS — S0990XA Unspecified injury of head, initial encounter: Secondary | ICD-10-CM

## 2019-02-03 DIAGNOSIS — Z85038 Personal history of other malignant neoplasm of large intestine: Secondary | ICD-10-CM | POA: Insufficient documentation

## 2019-02-03 MED ORDER — ACETAMINOPHEN 500 MG PO TABS
1000.0000 mg | ORAL_TABLET | Freq: Once | ORAL | Status: AC
  Administered 2019-02-03: 1000 mg via ORAL
  Filled 2019-02-03: qty 2

## 2019-02-03 NOTE — ED Provider Notes (Signed)
TIME SEEN: 4:02 AM  CHIEF COMPLAINT: fall, head injury  HPI: Patient is a 85 year old female with history of CAD, CHF status post pacemaker, chronic kidney disease who presents to the emergency department from [facility] facility after she states she fell out of bed and hit her head.  She has a hematoma to the left forehead.  Complaining of back pain.  Denies chest pain, abdominal pain.  No numbness or focal weakness.  Not on anticoagulation.  ROS: See HPI Constitutional: no fever  Eyes: no drainage  ENT: no runny nose   Cardiovascular:  no chest pain  Resp: no SOB  GI: no vomiting GU: no dysuria Integumentary: no rash  Allergy: no hives  Musculoskeletal: no leg swelling  Neurological: no slurred speech ROS otherwise negative  PAST MEDICAL HISTORY/PAST SURGICAL HISTORY:  Past Medical History:  Diagnosis Date  . Bradycardia   . CAD (coronary artery disease)   . Chronic systolic (congestive) heart failure ([place])   . CKD (chronic kidney disease), stage IV ([place])   . Colon cancer [facility])    s/p of colectomy  . GERD (gastroesophageal reflux disease)   . Gout   . Hospice care   . Iron deficiency anemia   . Status post placement of cardiac pacemaker     MEDICATIONS:  Prior to Admission medications   Medication Sig Start Date End Date Taking? Authorizing Provider  acetaminophen (TYLENOL) 500 MG tablet Take 500 mg by mouth every 6 (six) hours as needed for mild pain, moderate pain or headache.    [provider]  acetaminophen (TYLENOL) 500 MG tablet Take 500 mg by mouth at bedtime.    [provider]  allopurinol (ZYLOPRIM) 100 MG tablet Take 100 mg by mouth daily.    [provider]  amLODipine (NORVASC) 5 MG tablet Take 5 mg by mouth daily.    [provider]  aspirin 81 MG chewable tablet Chew 81 mg by mouth daily.    [provider]  carvedilol (COREG) 12.5 MG tablet Take 12.5 mg by mouth 2 (two) times daily with a meal.     [provider]  cholecalciferol (VITAMIN D) 1000 units tablet Take 1,000 Units by mouth daily.    [provider]  escitalopram (LEXAPRO) 10 MG tablet Take 10 mg by mouth daily.    [provider]  fentaNYL (DURAGESIC - DOSED MCG/HR) 75 MCG/HR Place 1 patch (75 mcg total) onto the skin every 3 (three) days. 11/05/18   Patrecia Pour, MD  ferrous sulfate 325 (65 FE) MG EC tablet Take 325 mg by mouth daily with breakfast.    [provider]  furosemide (LASIX) 40 MG tablet Take 40 mg by mouth at bedtime.    [provider]  hyoscyamine (LEVSIN, ANASPAZ) 0.125 MG tablet Take 0.125 mg by mouth every 4 (four) hours as needed (excess secretions).    [provider]  isosorbide mononitrate (IMDUR) 60 MG 24 hr tablet Take 60 mg by mouth daily.    [provider]  lactulose (CHRONULAC) 10 GM/15ML solution Take 30 g by mouth 2 (two) times daily as needed for mild constipation.    [provider]  Melatonin 5 MG TABS Take 5 mg by mouth at bedtime.    [provider]  omeprazole (PRILOSEC) 20 MG capsule Take 20 mg by mouth daily.    [provider]  oxyCODONE (OXY IR/ROXICODONE) 5 MG immediate release tablet Take 1 tablet (5 mg total) by  mouth every 4 (four) hours as needed for moderate pain or severe pain. 11/05/18   Patrecia Pour, MD  polyethylene glycol (MIRALAX / Floria Raveling) packet Take 17 g by mouth every other day.    [provider]  senna (SENOKOT) 8.6 MG tablet Take 2 tablets by mouth daily.     [provider]  traZODone (DESYREL) 100 MG tablet Take 100 mg by mouth at bedtime.    [provider]    ALLERGIES:  Allergies  Allergen Reactions  . Doxycycline Other (See Comments)    On MAR  . Penicillins Other (See Comments)    Has patient had a PCN reaction causing immediate rash, facial/tongue/throat swelling, SOB or lightheadedness with hypotension: Unknown Has patient had a PCN  reaction causing severe rash involving mucus membranes or skin necrosis: Unknown Has patient had a PCN reaction that required hospitalization: Unknown Has patient had a PCN reaction occurring within the last 10 years: Unknown If all of the above answers are "NO", then may proceed with Cephalosporin use.   . Sulfonamide Derivatives     SOCIAL HISTORY:  Social History   Tobacco Use  . Smoking status: Never Smoker  . Smokeless tobacco: Never Used  Substance Use Topics  . Alcohol use: Never    Frequency: Never    FAMILY HISTORY: Family History  Problem Relation Age of Onset  . AAA (abdominal aortic aneurysm) Mother     EXAM: BP (!) 116/55 (BP Location: Right Arm)   Pulse 64   Temp 98.4 F (36.9 C) (Oral)   Resp 20   SpO2 100%  CONSTITUTIONAL: Alert and oriented x 4 and responds appropriately to questions.  Elderly, thin, no distress HEAD: Normocephalic; hematoma to the left forehead EYES: Conjunctivae clear, PERRL, EOMI ENT: normal nose; no rhinorrhea; moist mucous membranes; pharynx without lesions noted; no dental injury; no septal hematoma NECK: Supple, no meningismus, no LAD; no midline spinal tenderness, step-off or deformity; trachea midline CARD: RRR; S1 and S2 appreciated; no murmurs, no clicks, no rubs, no gallops RESP: Normal chest excursion without splinting or tachypnea; breath sounds clear and equal bilaterally; no wheezes, no rhonchi, no rales; no hypoxia or respiratory distress CHEST:  chest wall stable, no crepitus or ecchymosis or deformity, nontender to palpation; no flail chest ABD/GI: Normal bowel sounds; non-distended; soft, non-tender, no rebound, no guarding; no ecchymosis or other lesions noted PELVIS:  stable, nontender to palpation BACK:  The back appears normal and is under over the thoracic and lumbar spine, there is no CVA tenderness; no midline step-off or deformity EXT: Normal ROM in all joints; non-tender to palpation; no edema; normal capillary  refill; no cyanosis, no bony tenderness or bony deformity of patient's extremities, no joint effusion, compartments are soft, extremities are warm and well-perfused, no ecchymosis SKIN: Normal color for age and race; warm NEURO: Moves all extremities equally, normal sensation diffusely, normal speech, cranial nerves II through XII intact, no drift PSYCH: The patient's mood and manner are appropriate. Grooming and personal hygiene are appropriate.  MEDICAL DECISION MAKING: Patient here after fall out of bed.  Has hematoma to the left forehead and back tenderness on exam.  Neurologically intact and hemodynamically stable.  Will give Tylenol for pain and patient agrees.  Will obtain CT of the head, cervical spine and x-ray of the thoracic and lumbar spine.  ED PROGRESS: Patient's CT head and cervical spine show no acute injury other than left frontal scalp hematoma.  She does have cervical spondylosis  at C3-C4 with moderate canal stenosis and probable cord impingement.  She has good strength in all 4 extremities and denies any urinary retention or urinary bladder incontinence.  Ports bowel incontinence for months.  This is unchanged.  Will give outpatient neurosurgery follow-up information but doubt that patient is a good surgical candidate.  X-rays of the lumbar spine show T11 and T12 compression fractures that appear chronic.  On reexamination, she has no tenderness to palpation in these areas.  Doubt that they are acute.  When asked if patient would want cervical spine surgery if deemed necessary to prevent neurologic deficits, patient states no.  Patient is elderly but appears mentally sharp.  Will provide her with neurosurgery follow-up as needed if she changes her mind.  I do not feel neurosurgery needs to be involved emergently given patient declines intervention at this time.  He feels better after Tylenol and comfortable with discharge back to [facility].   At this time, I do not feel there is any  life-threatening condition present. I have reviewed and discussed all results (EKG, imaging, lab, urine as appropriate) and exam findings with patient/family. I have reviewed nursing notes and appropriate previous records.  I feel the patient is safe to be discharged home without further emergent workup and can continue workup as an outpatient as needed. Discussed usual and customary return precautions. Patient/family verbalize understanding and are comfortable with this plan.  Outpatient follow-up has been provided as needed. All questions have been answered.      Ward, Delice Bison, DO 02/03/19 [phone]

## 2019-02-03 NOTE — ED Triage Notes (Signed)
BIB EMS from [facility]. Pt had unwitnessed fall, was found on floor by staff. Presents with hematoma to L forehead. A/O at baseline. No thinners per chart. EDP at bedside.

## 2019-02-03 NOTE — Discharge Instructions (Addendum)
You may take Tylenol 1000 mg every 6 hours as needed for pain.  Your CT scans today showed no acute injury other than the hematoma to your left forehead.  You were found to have moderate canal stenosis of your cervical spine with some cord impingement.  If this were to progress it could cause you to have difficulty walking, numbness or weakness, difficulty holding your bowel or bladder.  We have discussed this finding with you and you have stated that you do not want surgical intervention.  We are giving you neurosurgery follow-up information if you change your mind.

## 2019-02-03 NOTE — ED Notes (Signed)
Pt voided in brief. Pt cleaned and new brief put on.

## 2019-02-16 ENCOUNTER — Emergency Department (HOSPITAL_COMMUNITY): Payer: Medicare Other

## 2019-02-16 ENCOUNTER — Inpatient Hospital Stay (HOSPITAL_COMMUNITY): Payer: Medicare Other

## 2019-02-16 ENCOUNTER — Other Ambulatory Visit: Payer: Self-pay

## 2019-02-16 ENCOUNTER — Encounter (HOSPITAL_COMMUNITY): Payer: Self-pay | Admitting: Oncology

## 2019-02-16 ENCOUNTER — Inpatient Hospital Stay (HOSPITAL_COMMUNITY)
Admission: EM | Admit: 2019-02-16 | Discharge: 2019-02-18 | DRG: 682 | Disposition: A | Discharge status: Skilled Nursing Facility | Payer: Medicare Other | Source: Skilled Nursing Facility | Attending: Internal Medicine | Admitting: Internal Medicine

## 2019-02-16 DIAGNOSIS — Z881 Allergy status to other antibiotic agents status: Secondary | ICD-10-CM

## 2019-02-16 DIAGNOSIS — W06XXXA Fall from bed, initial encounter: Secondary | ICD-10-CM | POA: Diagnosis present

## 2019-02-16 DIAGNOSIS — Z681 Body mass index (BMI) 19 or less, adult: Secondary | ICD-10-CM

## 2019-02-16 DIAGNOSIS — C649 Malignant neoplasm of unspecified kidney, except renal pelvis: Secondary | ICD-10-CM | POA: Diagnosis present

## 2019-02-16 DIAGNOSIS — Z66 Do not resuscitate: Secondary | ICD-10-CM | POA: Diagnosis present

## 2019-02-16 DIAGNOSIS — Z96642 Presence of left artificial hip joint: Secondary | ICD-10-CM | POA: Diagnosis present

## 2019-02-16 DIAGNOSIS — Z9049 Acquired absence of other specified parts of digestive tract: Secondary | ICD-10-CM

## 2019-02-16 DIAGNOSIS — I130 Hypertensive heart and chronic kidney disease with heart failure and stage 1 through stage 4 chronic kidney disease, or unspecified chronic kidney disease: Secondary | ICD-10-CM | POA: Diagnosis present

## 2019-02-16 DIAGNOSIS — I5042 Chronic combined systolic (congestive) and diastolic (congestive) heart failure: Secondary | ICD-10-CM | POA: Diagnosis present

## 2019-02-16 DIAGNOSIS — E43 Unspecified severe protein-calorie malnutrition: Secondary | ICD-10-CM | POA: Diagnosis present

## 2019-02-16 DIAGNOSIS — K219 Gastro-esophageal reflux disease without esophagitis: Secondary | ICD-10-CM | POA: Diagnosis present

## 2019-02-16 DIAGNOSIS — D631 Anemia in chronic kidney disease: Secondary | ICD-10-CM | POA: Diagnosis present

## 2019-02-16 DIAGNOSIS — Z993 Dependence on wheelchair: Secondary | ICD-10-CM | POA: Diagnosis not present

## 2019-02-16 DIAGNOSIS — Z950 Presence of cardiac pacemaker: Secondary | ICD-10-CM | POA: Diagnosis not present

## 2019-02-16 DIAGNOSIS — I10 Essential (primary) hypertension: Secondary | ICD-10-CM | POA: Diagnosis present

## 2019-02-16 DIAGNOSIS — Z79899 Other long term (current) drug therapy: Secondary | ICD-10-CM

## 2019-02-16 DIAGNOSIS — N184 Chronic kidney disease, stage 4 (severe): Secondary | ICD-10-CM | POA: Diagnosis present

## 2019-02-16 DIAGNOSIS — R5381 Other malaise: Secondary | ICD-10-CM | POA: Diagnosis present

## 2019-02-16 DIAGNOSIS — Z79891 Long term (current) use of opiate analgesic: Secondary | ICD-10-CM

## 2019-02-16 DIAGNOSIS — N179 Acute kidney failure, unspecified: Secondary | ICD-10-CM | POA: Diagnosis not present

## 2019-02-16 DIAGNOSIS — S0083XA Contusion of other part of head, initial encounter: Secondary | ICD-10-CM | POA: Diagnosis present

## 2019-02-16 DIAGNOSIS — Z882 Allergy status to sulfonamides status: Secondary | ICD-10-CM

## 2019-02-16 DIAGNOSIS — R627 Adult failure to thrive: Secondary | ICD-10-CM | POA: Diagnosis present

## 2019-02-16 DIAGNOSIS — R300 Dysuria: Secondary | ICD-10-CM | POA: Diagnosis present

## 2019-02-16 DIAGNOSIS — I5022 Chronic systolic (congestive) heart failure: Secondary | ICD-10-CM | POA: Diagnosis present

## 2019-02-16 DIAGNOSIS — M109 Gout, unspecified: Secondary | ICD-10-CM | POA: Diagnosis present

## 2019-02-16 DIAGNOSIS — Z90710 Acquired absence of both cervix and uterus: Secondary | ICD-10-CM | POA: Diagnosis not present

## 2019-02-16 DIAGNOSIS — Z85038 Personal history of other malignant neoplasm of large intestine: Secondary | ICD-10-CM | POA: Diagnosis not present

## 2019-02-16 DIAGNOSIS — Z880 Allergy status to penicillin: Secondary | ICD-10-CM | POA: Diagnosis not present

## 2019-02-16 DIAGNOSIS — I2510 Atherosclerotic heart disease of native coronary artery without angina pectoris: Secondary | ICD-10-CM | POA: Diagnosis present

## 2019-02-16 DIAGNOSIS — R296 Repeated falls: Secondary | ICD-10-CM | POA: Diagnosis present

## 2019-02-16 DIAGNOSIS — G8929 Other chronic pain: Secondary | ICD-10-CM | POA: Diagnosis present

## 2019-02-16 DIAGNOSIS — Z7984 Long term (current) use of oral hypoglycemic drugs: Secondary | ICD-10-CM

## 2019-02-16 DIAGNOSIS — Z7982 Long term (current) use of aspirin: Secondary | ICD-10-CM

## 2019-02-16 DIAGNOSIS — W19XXXA Unspecified fall, initial encounter: Secondary | ICD-10-CM

## 2019-02-16 DIAGNOSIS — R350 Frequency of micturition: Secondary | ICD-10-CM | POA: Diagnosis present

## 2019-02-16 LAB — CBC WITH DIFFERENTIAL/PLATELET
Abs Immature Granulocytes: 0.02 10*3/uL (ref 0.00–0.07)
Basophils Absolute: 0 10*3/uL (ref 0.0–0.1)
Basophils Relative: 0 %
Eosinophils Absolute: 0 10*3/uL (ref 0.0–0.5)
Eosinophils Relative: 0 %
HCT: 27.2 % — ABNORMAL LOW (ref 36.0–46.0)
Hemoglobin: 8.3 g/dL — ABNORMAL LOW (ref 12.0–15.0)
Immature Granulocytes: 1 %
Lymphocytes Relative: 16 %
Lymphs Abs: 0.7 10*3/uL (ref 0.7–4.0)
MCH: 29.2 pg (ref 26.0–34.0)
MCHC: 30.5 g/dL (ref 30.0–36.0)
MCV: 95.8 fL (ref 80.0–100.0)
Monocytes Absolute: 0.2 10*3/uL (ref 0.1–1.0)
Monocytes Relative: 6 %
Neutro Abs: 3.3 10*3/uL (ref 1.7–7.7)
Neutrophils Relative %: 77 %
Platelets: 137 10*3/uL — ABNORMAL LOW (ref 150–400)
RBC: 2.84 MIL/uL — ABNORMAL LOW (ref 3.87–5.11)
RDW: 16.1 % — ABNORMAL HIGH (ref 11.5–15.5)
WBC: 4.3 10*3/uL (ref 4.0–10.5)
nRBC: 0.7 % — ABNORMAL HIGH (ref 0.0–0.2)

## 2019-02-16 LAB — CBC
HCT: 27 % — ABNORMAL LOW (ref 36.0–46.0)
Hemoglobin: 8.3 g/dL — ABNORMAL LOW (ref 12.0–15.0)
MCH: 28.6 pg (ref 26.0–34.0)
MCHC: 30.7 g/dL (ref 30.0–36.0)
MCV: 93.1 fL (ref 80.0–100.0)
Platelets: 139 10*3/uL — ABNORMAL LOW (ref 150–400)
RBC: 2.9 MIL/uL — ABNORMAL LOW (ref 3.87–5.11)
RDW: 15.9 % — AB (ref 11.5–15.5)
WBC: 4.5 10*3/uL (ref 4.0–10.5)
nRBC: 0.4 % — ABNORMAL HIGH (ref 0.0–0.2)

## 2019-02-16 LAB — BASIC METABOLIC PANEL
Anion gap: 11 (ref 5–15)
BUN: 90 mg/dL — ABNORMAL HIGH (ref 8–23)
CO2: 21 mmol/L — ABNORMAL LOW (ref 22–32)
Calcium: 9 mg/dL (ref 8.9–10.3)
Chloride: 111 mmol/L (ref 98–111)
Creatinine, Ser: 4.55 mg/dL — ABNORMAL HIGH (ref 0.44–1.00)
GFR calc Af Amer: 9 mL/min — ABNORMAL LOW (ref >60)
GFR calc non Af Amer: 8 mL/min — ABNORMAL LOW (ref >60)
Glucose, Bld: 105 mg/dL — ABNORMAL HIGH (ref 70–99)
Potassium: 4.4 mmol/L (ref 3.5–5.1)
Sodium: 143 mmol/L (ref 135–145)

## 2019-02-16 LAB — URINALYSIS, ROUTINE W REFLEX MICROSCOPIC
Bilirubin Urine: NEGATIVE
Glucose, UA: NEGATIVE mg/dL
Hgb urine dipstick: NEGATIVE
Ketones, ur: NEGATIVE mg/dL
Leukocytes,Ua: NEGATIVE
Nitrite: NEGATIVE
Protein, ur: NEGATIVE mg/dL
Specific Gravity, Urine: 1.012 (ref 1.005–1.030)
pH: 5 (ref 5.0–8.0)

## 2019-02-16 LAB — CREATININE, SERUM
CREATININE: 4.2 mg/dL — AB (ref 0.44–1.00)
GFR calc Af Amer: 10 mL/min — ABNORMAL LOW (ref >60)
GFR calc non Af Amer: 9 mL/min — ABNORMAL LOW (ref >60)

## 2019-02-16 LAB — CK: Total CK: 88 U/L (ref 38–234)

## 2019-02-16 LAB — MRSA PCR SCREENING: MRSA by PCR: NEGATIVE

## 2019-02-16 MED ORDER — SODIUM CHLORIDE 0.9 % IV BOLUS
500.0000 mL | Freq: Once | INTRAVENOUS | Status: AC
  Administered 2019-02-16: 500 mL via INTRAVENOUS

## 2019-02-16 MED ORDER — ONDANSETRON HCL 4 MG/2ML IJ SOLN: 4.0000 mg | Freq: Four times a day (QID) | INTRAMUSCULAR | Status: DC | PRN

## 2019-02-16 MED ORDER — POLYETHYLENE GLYCOL 3350 17 G PO PACK
17.0000 g | PACK | Freq: Two times a day (BID) | ORAL | Status: DC
  Administered 2019-02-16 – 2019-02-17 (×3): 17 g via ORAL
  Filled 2019-02-16 (×5): qty 1

## 2019-02-16 MED ORDER — ESCITALOPRAM OXALATE 10 MG PO TABS
10.0000 mg | ORAL_TABLET | Freq: Every day | ORAL | Status: DC
  Administered 2019-02-17 – 2019-02-18 (×2): 10 mg via ORAL
  Filled 2019-02-16 (×2): qty 1

## 2019-02-16 MED ORDER — ALLOPURINOL 100 MG PO TABS
100.0000 mg | ORAL_TABLET | Freq: Every day | ORAL | Status: DC
  Administered 2019-02-16 – 2019-02-18 (×3): 100 mg via ORAL
  Filled 2019-02-16 (×3): qty 1

## 2019-02-16 MED ORDER — OXYCODONE HCL 5 MG PO TABS
5.0000 mg | ORAL_TABLET | ORAL | Status: DC | PRN
  Administered 2019-02-17 – 2019-02-18 (×3): 5 mg via ORAL
  Filled 2019-02-16 (×3): qty 1

## 2019-02-16 MED ORDER — ONDANSETRON HCL 4 MG PO TABS: 4.0000 mg | ORAL_TABLET | Freq: Four times a day (QID) | ORAL | Status: DC | PRN

## 2019-02-16 MED ORDER — PANTOPRAZOLE SODIUM 40 MG PO TBEC
40.0000 mg | DELAYED_RELEASE_TABLET | Freq: Every day | ORAL | Status: DC
  Administered 2019-02-17 – 2019-02-18 (×2): 40 mg via ORAL
  Filled 2019-02-16 (×2): qty 1

## 2019-02-16 MED ORDER — SENNA 8.6 MG PO TABS
2.0000 | ORAL_TABLET | Freq: Two times a day (BID) | ORAL | Status: DC
  Administered 2019-02-16 – 2019-02-17 (×3): 17.2 mg via ORAL
  Filled 2019-02-16 (×8): qty 2

## 2019-02-16 MED ORDER — ASPIRIN 81 MG PO CHEW
81.0000 mg | CHEWABLE_TABLET | Freq: Every day | ORAL | Status: DC
  Administered 2019-02-16 – 2019-02-18 (×3): 81 mg via ORAL
  Filled 2019-02-16 (×3): qty 1

## 2019-02-16 MED ORDER — ACETAMINOPHEN 500 MG PO TABS
500.0000 mg | ORAL_TABLET | Freq: Every day | ORAL | Status: DC
  Administered 2019-02-16 – 2019-02-17 (×2): 500 mg via ORAL
  Filled 2019-02-16 (×2): qty 1

## 2019-02-16 MED ORDER — SODIUM CHLORIDE 0.9 % IV SOLN
INTRAVENOUS | Status: DC
  Administered 2019-02-16: 13:00:00 via INTRAVENOUS

## 2019-02-16 MED ORDER — LINACLOTIDE 145 MCG PO CAPS
145.0000 ug | ORAL_CAPSULE | Freq: Every day | ORAL | Status: DC
  Administered 2019-02-17 – 2019-02-18 (×2): 145 ug via ORAL
  Filled 2019-02-16 (×2): qty 1

## 2019-02-16 MED ORDER — CARVEDILOL 12.5 MG PO TABS
12.5000 mg | ORAL_TABLET | Freq: Two times a day (BID) | ORAL | Status: DC
  Administered 2019-02-16 – 2019-02-18 (×4): 12.5 mg via ORAL
  Filled 2019-02-16 (×4): qty 1

## 2019-02-16 MED ORDER — ONDANSETRON HCL 4 MG/2ML IJ SOLN: 4.0000 mg | Freq: Once | INTRAMUSCULAR | Status: DC

## 2019-02-16 MED ORDER — HEPARIN SODIUM (PORCINE) 5000 UNIT/ML IJ SOLN
5000.0000 [IU] | Freq: Three times a day (TID) | INTRAMUSCULAR | Status: DC
  Administered 2019-02-16 – 2019-02-18 (×5): 5000 [IU] via SUBCUTANEOUS
  Filled 2019-02-16 (×5): qty 1

## 2019-02-16 MED ORDER — TRAZODONE HCL 50 MG PO TABS
50.0000 mg | ORAL_TABLET | Freq: Every evening | ORAL | Status: DC | PRN
  Administered 2019-02-17 (×2): 50 mg via ORAL
  Filled 2019-02-16 (×2): qty 1

## 2019-02-16 MED ORDER — ACETAMINOPHEN 500 MG PO TABS: 500.0000 mg | ORAL_TABLET | Freq: Four times a day (QID) | ORAL | Status: DC | PRN

## 2019-02-16 NOTE — ED Triage Notes (Signed)
Pt bib [facility] from [facility] s/p fall.  Staff reported to EMS pt had 4-5 falls last week.  Pt found on the floor beside her bed tonight.  Pt has bruising noted to her left cheek as well as a knot to her left forehead.  Pt denies neck/back pain.

## 2019-02-16 NOTE — ED Provider Notes (Signed)
[facility] EMERGENCY DEPARTMENT Provider Note   CSN: [id number] Arrival date & time: 02/16/19  0448    History   Chief Complaint Chief Complaint  Patient presents with  . Fall    HPI Laura Houston is a 85 y.o. female with history of CAD, CHF, CKD, GERD presents for evaluation of acute onset, persistent "not feeling well "since yesterday morning.  She was brought in via EMS from her skilled nursing facility for evaluation of a fall at around 2 AM.  She reports that she was in bed when she rolled over and fell out of the bed landing on her left side.  She denies loss of consciousness or head injury.  She notes some pain to the left knee which does not radiate, worsens with flexion of the left knee.  Denies any numbness or tingling.  No vision changes.  She endorses nausea but no vomiting or abdominal pain.  She states that she has not been feeling well since yesterday morning but cannot elaborate further.  She does note decreased oral intake and decreased appetite.  Also complains of dysuria and urinary frequency for approximately 1 week.  Daughter at the bedside reports that she is at her mental status baseline and states that she has a tendency to "sundown ".  She is alert and oriented to person only at this time.  She is not currently anticoagulated. Triage note by RN states patient has fallen 4-5 times in the last week. Patient currently ambulates with the aid of a walker. She states she is DNR.     The history is provided by the patient.    Past Medical History:  Diagnosis Date  . Bradycardia   . CAD (coronary artery disease)   . Chronic systolic (congestive) heart failure ([place])   . CKD (chronic kidney disease), stage IV ([place])   . Colon cancer [facility])    s/p of colectomy  . GERD (gastroesophageal reflux disease)   . Gout   . Hospice care   . Iron deficiency anemia   . Status post placement of cardiac pacemaker     Patient Active Problem List   Diagnosis  Date Noted  . Fall 10/29/2018  . Hyperkalemia 10/29/2018  . Closed displaced fracture of left femoral neck ([place]) 10/29/2018  . Protein-calorie malnutrition, severe 10/29/2018  . Status post placement of cardiac pacemaker   . Iron deficiency anemia   . Hospice care   . Gout   . GERD (gastroesophageal reflux disease)   . CKD (chronic kidney disease), stage IV ([place])   . Chronic systolic (congestive) heart failure ([place])   . CAD (coronary artery disease)   . Bradycardia   . HYPERTENSION, MILD 08/26/2010  . BRADYCARDIA 08/26/2010  . EDEMA 08/26/2010  . Coronary atherosclerosis 08/24/2010    Past Surgical History:  Procedure Laterality Date  . ABDOMINAL HYSTERECTOMY    . HIP ARTHROPLASTY Left 10/29/2018   Procedure: LEFT HIP HEMIARTHROPLASTY;  Surgeon: Altamese Holden, MD;  Location: [facility];  Service: Orthopedics;  Laterality: Left;  . PARTIAL COLECTOMY       OB History   No obstetric history on file.      Home Medications    Prior to Admission medications   Medication Sig Start Date End Date Taking? Authorizing Provider  acetaminophen (TYLENOL) 500 MG tablet Take 500 mg by mouth every 6 (six) hours as needed for mild pain, moderate pain or headache.    [provider]  acetaminophen (TYLENOL) 500 MG  tablet Take 500 mg by mouth at bedtime.    [provider]  allopurinol (ZYLOPRIM) 100 MG tablet Take 100 mg by mouth daily.    [provider]  amLODipine (NORVASC) 5 MG tablet Take 5 mg by mouth daily.    [provider]  aspirin 81 MG chewable tablet Chew 81 mg by mouth daily.    [provider]  carvedilol (COREG) 12.5 MG tablet Take 12.5 mg by mouth 2 (two) times daily with a meal.    [provider]  cholecalciferol (VITAMIN D) 1000 units tablet Take 1,000 Units by mouth daily.    [provider]  escitalopram (LEXAPRO) 10 MG tablet Take 10 mg by mouth daily.    [provider]  fentaNYL (DURAGESIC - DOSED  MCG/HR) 75 MCG/HR Place 1 patch (75 mcg total) onto the skin every 3 (three) days. 11/05/18   Patrecia Pour, MD  ferrous sulfate 325 (65 FE) MG EC tablet Take 325 mg by mouth 2 (two) times daily.     [provider]  furosemide (LASIX) 40 MG tablet Take 40 mg by mouth at bedtime.    [provider]  hyoscyamine (LEVSIN, ANASPAZ) 0.125 MG tablet Take 0.125 mg by mouth every 4 (four) hours as needed (excess secretions).    [provider]  isosorbide mononitrate (IMDUR) 60 MG 24 hr tablet Take 60 mg by mouth daily.    [provider]  lactulose (CHRONULAC) 10 GM/15ML solution Take 30 g by mouth 2 (two) times daily as needed for mild constipation.    [provider]  linaclotide (LINZESS) 145 MCG CAPS capsule Take 145 mcg by mouth daily before breakfast.    [provider]  Melatonin 3 MG TABS Take 6 mg by mouth at bedtime.    [provider]  mineral oil enema Place 1 enema rectally as needed for severe constipation.    [provider]  omeprazole (PRILOSEC) 20 MG capsule Take 20 mg by mouth daily.    [provider]  oxyCODONE (OXY IR/ROXICODONE) 5 MG immediate release tablet Take 1 tablet (5 mg total) by mouth every 4 (four) hours as needed for moderate pain or severe pain. 11/05/18   Patrecia Pour, MD  polyethylene glycol (MIRALAX / Floria Raveling) packet Take 17 g by mouth 2 (two) times daily.     [provider]  senna (SENOKOT) 8.6 MG tablet Take 2 tablets by mouth 2 (two) times daily.     [provider]  traZODone (DESYREL) 100 MG tablet Take 100 mg by mouth at bedtime.    [provider]  vitamin C (ASCORBIC ACID) 500 MG tablet Take 500 mg by mouth daily.    [provider]    Family History Family History  Problem Relation Age of Onset  . AAA (abdominal aortic aneurysm) Mother     Social History Social History   Tobacco Use  . Smoking status: Never Smoker  . Smokeless  tobacco: Never Used  Substance Use Topics  . Alcohol use: Never    Frequency: Never  . Drug use: Never     Allergies   Doxycycline; Penicillins; and Sulfonamide derivatives   Review of Systems Review of Systems  Constitutional: Negative for chills and fever.  Eyes: Negative for visual disturbance.  Respiratory: Negative for shortness of breath.   Cardiovascular: Negative for chest pain.  Gastrointestinal: Positive for nausea. Negative for abdominal pain.  Genitourinary: Positive for dysuria and frequency. Negative for  hematuria.  Musculoskeletal: Negative for back pain and neck pain.  Skin: Positive for color change.  Neurological: Negative for syncope, light-headedness, numbness and headaches.  All other systems reviewed and are negative.    Physical Exam Updated Vital Signs BP 105/65 (BP Location: Right Arm)   Pulse 66   Temp 98.1 F (36.7 C) (Oral)   Resp 18   SpO2 100%   Physical Exam Vitals signs and nursing note reviewed.  Constitutional:      General: She is not in acute distress.    Appearance: She is well-developed.  HENT:     Head: Normocephalic.     Comments: 4cmx4cm area of swelling and fluctuance to the left side of the forehead. Patient reports this has been present since fall evaluated on 02/03/19 which is unchanged. No erythema or induration, mild tenderness to palpation. There is also ecchymosis in varius stages of healing to the left side of the face since fall last month. No tenderness to palpation of the face    Mouth/Throat:     Mouth: Mucous membranes are dry.  Eyes:     General:        Right eye: No discharge.        Left eye: No discharge.     Extraocular Movements: Extraocular movements intact.     Conjunctiva/sclera: Conjunctivae normal.     Pupils: Pupils are equal, round, and reactive to light.  Neck:     Musculoskeletal: Normal range of motion and neck supple.     Vascular: No JVD.     Trachea: No tracheal deviation.    Cardiovascular:     Rate and Rhythm: Normal rate.     Pulses: Normal pulses.     Heart sounds: Normal heart sounds.  Pulmonary:     Effort: Pulmonary effort is normal.     Breath sounds: Normal breath sounds.  Chest:     Chest wall: No tenderness.  Abdominal:     General: Abdomen is flat. Bowel sounds are normal. There is no distension.     Tenderness: There is no abdominal tenderness. There is no guarding or rebound.  Musculoskeletal: Normal range of motion.        General: Tenderness present. No deformity.     Comments: Tenderness to palpation noted to the medial aspect of the left knee.  Some crepitus noted with flexion.  No varus or valgus instability.  5/5 strength of BUE and BLE major muscle groups.  Pelvis appears stable.  Normal passive range of motion of the hips bilaterally. No midline spine tenderness to palpation, no paraspinal muscle tenderness, no deformity, crepitus, or step-off noted   Skin:    General: Skin is warm and dry.     Findings: No erythema.  Neurological:     Mental Status: She is alert.     Comments: Fluent speech with no evidence of dysarthria or aphasia.  No facial droop.  Sensation intact to soft touch of bilateral upper and lower extremities and face.  Cranial nerves appear grossly intact.  Patient is alert to person but not place or time.  Her daughter reports that this is essentially baseline.  Psychiatric:        Behavior: Behavior normal.      ED Treatments / Results  Labs (all labs ordered are listed, but only abnormal results are displayed) Labs Reviewed  CBC WITH DIFFERENTIAL/PLATELET - Abnormal; Notable for the following components:      Result Value   RBC 2.84 (*)  Hemoglobin 8.3 (*)    HCT 27.2 (*)    RDW 16.1 (*)    Platelets 137 (*)    nRBC 0.7 (*)    All other components within normal limits  BASIC METABOLIC PANEL - Abnormal; Notable for the following components:   CO2 21 (*)    Glucose, Bld 105 (*)    BUN 90 (*)     Creatinine, Ser 4.55 (*)    GFR calc non Af Amer 8 (*)    GFR calc Af Amer 9 (*)    All other components within normal limits  URINE CULTURE  URINALYSIS, ROUTINE W REFLEX MICROSCOPIC    EKG None  Radiology Ct Head Wo Contrast  Result Date: 02/16/2019 CLINICAL DATA:  Multiple recent falls.  LEFT forehead swelling. EXAM: CT HEAD WITHOUT CONTRAST TECHNIQUE: Contiguous axial images were obtained from the base of the skull through the vertex without intravenous contrast. COMPARISON:  CT HEAD February 03, 2019 FINDINGS: BRAIN: No intraparenchymal hemorrhage, mass effect nor midline shift. No parenchymal brain volume loss for age. No hydrocephalus. Patchy supratentorial white matter hypodensities. 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 No acute large vascular territory infarcts. No abnormal extra-axial fluid collections. Basal cisterns are patent. VASCULAR: Moderate calcific atherosclerosis of the carotid siphons. Dolichoectatic intracranial vessels seen with chronic hypertension. SKULL: No skull fracture. 1.6 x 2 cm LEFT frontal complex nodule at site of prior hematoma. SINUSES/ORBITS: Trace paranasal sinus mucosal thickening. Mastoid air cells are well aerated.The included ocular globes and orbital contents are non-suspicious. Status post bilateral ocular lens implants. OTHER: LEFT periorbital soft tissue swelling without subcutaneous gas or radiopaque foreign bodies. IMPRESSION: 1. No acute intracranial process. 2. 2 cm LEFT frontal complex nodule, likely residual hematoma given site of recent trauma. 3. Otherwise stable examination including moderate chronic small vessel ischemic changes. Electronically Signed   By: Elon Alas M.D.   On: 02/16/2019 05:56    Procedures Procedures (including critical care time)  Medications Ordered in ED Medications  ondansetron (ZOFRAN) injection 4 mg (has no administration in time range)  sodium chloride 0.9 % bolus 500 mL  (500 mLs Intravenous New Bag/Given 02/16/19 0601)     Initial Impression / Assessment and Plan / ED Course  I have reviewed the triage vital signs and the nursing notes.  Pertinent labs & imaging results that were available during my care of the patient were reviewed by me and considered in my medical decision making (see chart for details).        Patient presents sent from skilled nursing facility for evaluation after a fall earlier this morning.  She is afebrile, vital signs are stable.  She is nontoxic in appearance.  No focal neurologic deficits.  Denies any head injury or loss of consciousness with this fall but apparently has been falling more this week.  Recently evaluated in the ED on 02/03/2023 a fall and since then has had decreased p.o. intake.  Still has evidence of head trauma from this fall but reports this is improving.  Complains of left knee pain on examination but refuses radiographs of this.  I think this is reasonable as there is no deformity and she is neurovascularly intact otherwise.  Head CT today shows no acute intracranial abnormalities.  She does have a 2 cm left forehead nodule consistent with likely residual hematoma.  Lab work today shows anemia with stable H&H, AKI with creatinine of 4.55 and BUN of 90.  No electrolyte abnormalities.  Given fluid bolus in the ED.  Awaiting UA.  Suspect AKI secondary to dehydration though could potentially have a superimposed UTI given complaints of dysuria and frequency for the last week.  Will require admission for further management of her worsening kidney function.  Patient does not appear to be septic at this time.  Spoke with Dr. Alcario Drought with [facility] hospitalist service who agrees to assume care of patient and bring her to the hospital for further evaluation and management.  Patient seen and evaluated by Dr. Roxanne Mins who agrees with assessment and plan at this time.  Final Clinical Impressions(s) / ED Diagnoses   Final diagnoses:  AKI  (acute kidney injury) ([place])  Fall, initial encounter  Contusion of face, initial encounter    ED Discharge Orders    None       Debroah Baller 72/25/75 0518    Delora Fuel, MD 33/58/25 [phone]

## 2019-02-16 NOTE — Progress Notes (Signed)
Patient arrives to [facility], with daughter at bedside, pt initially placed in regular bed due to lack of low bed.   Pt denies complaints, she is a/ox4. Pt swabbed for MRSA per screening protocol, from ALF.   Low bed received promptly after pt arrival.   Pt transferred to low bed, floormats in place.

## 2019-02-16 NOTE — ED Notes (Signed)
ED TO INPATIENT HANDOFF REPORT  ED Nurse Name and Phone #: [phone]  S Name/Age/Gender Laura Houston 85 y.o. female Room/Bed: H013C/H013C  Code Status   Code Status: Prior  Home/SNF/Other Skilled nursing facility Patient oriented to: self, place, time and situation Is this baseline? Yes   Triage Complete: Triage complete  Chief Complaint Fall & AKI  Triage Note Pt bib [facility] from [facility] s/p fall.  Staff reported to EMS pt had 4-5 falls last week.  Pt found on the floor beside her bed tonight.  Pt has bruising noted to her left cheek as well as a knot to her left forehead.  Pt denies neck/back pain.    Allergies Allergies  Allergen Reactions  . Doxycycline Other (See Comments)    On MAR  . Penicillins Other (See Comments)    Has patient had a PCN reaction causing immediate rash, facial/tongue/throat swelling, SOB or lightheadedness with hypotension: Unknown Has patient had a PCN reaction causing severe rash involving mucus membranes or skin necrosis: Unknown Has patient had a PCN reaction that required hospitalization: Unknown Has patient had a PCN reaction occurring within the last 10 years: Unknown If all of the above answers are "NO", then may proceed with Cephalosporin use.   . Sulfonamide Derivatives     Level of Care/Admitting Diagnosis ED Disposition    ED Disposition Condition [facility] Area: [place] [[id number]]  Level of Care: Medical Telemetry [104]  Diagnosis: AKI (acute kidney injury) [facility]) [[id number]]  Admitting Physician: Domenic Polite [[id number]]  Attending Physician: Domenic Polite [[id number]]  Estimated length of stay: 3 - 4 days  Certification:: I certify this patient will need inpatient services for at least 2 midnights  PT Class (Do Not Modify): Inpatient [101]  PT Acc Code (Do Not Modify): Private [1]       B Medical/Surgery History Past Medical History:  Diagnosis Date  . Bradycardia   . CAD (coronary  artery disease)   . Chronic systolic (congestive) heart failure ([place])   . CKD (chronic kidney disease), stage IV ([place])   . Colon cancer [facility])    s/p of colectomy  . GERD (gastroesophageal reflux disease)   . Gout   . Hospice care   . Iron deficiency anemia   . Status post placement of cardiac pacemaker    Past Surgical History:  Procedure Laterality Date  . ABDOMINAL HYSTERECTOMY    . HIP ARTHROPLASTY Left 10/29/2018   Procedure: LEFT HIP HEMIARTHROPLASTY;  Surgeon: Altamese Weyerhaeuser, MD;  Location: [place];  Service: Orthopedics;  Laterality: Left;  . PARTIAL COLECTOMY       A IV Location/Drains/Wounds Patient Lines/Drains/Airways Status   Active Line/Drains/Airways    Name:   Placement date:   Placement time:   Site:   Days:   Peripheral IV 02/16/19 Left Forearm   02/16/19    0558    Forearm   less than 1   External Urinary Catheter   11/03/18    1742    -   105   Incision (Closed) 10/29/18 Hip Left   10/29/18    1705     110          Intake/Output Last 24 hours  Intake/Output Summary (Last 24 hours) at 02/16/2019 0843 Last data filed at 02/16/2019 0801 Gross per 24 hour  Intake 500 ml  Output -  Net 500 ml    Labs/Imaging Results for orders placed or performed during the hospital encounter  of 02/16/19 (from the past 48 hour(s))  CBC with Differential     Status: Abnormal   Collection Time: 02/16/19  5:24 AM  Result Value Ref Range   WBC 4.3 4.0 - 10.5 K/uL   RBC 2.84 (L) 3.87 - 5.11 MIL/uL   Hemoglobin 8.3 (L) 12.0 - 15.0 g/dL   HCT 27.2 (L) 36.0 - 46.0 %   MCV 95.8 80.0 - 100.0 fL   MCH 29.2 26.0 - 34.0 pg   MCHC 30.5 30.0 - 36.0 g/dL   RDW 16.1 (H) 11.5 - 15.5 %   Platelets 137 (L) 150 - 400 K/uL   nRBC 0.7 (H) 0.0 - 0.2 %   Neutrophils Relative % 77 %   Neutro Abs 3.3 1.7 - 7.7 K/uL   Lymphocytes Relative 16 %   Lymphs Abs 0.7 0.7 - 4.0 K/uL   Monocytes Relative 6 %   Monocytes Absolute 0.2 0.1 - 1.0 K/uL   Eosinophils Relative 0 %   Eosinophils Absolute  0.0 0.0 - 0.5 K/uL   Basophils Relative 0 %   Basophils Absolute 0.0 0.0 - 0.1 K/uL   Immature Granulocytes 1 %   Abs Immature Granulocytes 0.02 0.00 - 0.07 K/uL    Comment: Performed at [facility], [address], [place], [place] [address]  Basic metabolic panel     Status: Abnormal   Collection Time: 02/16/19  5:24 AM  Result Value Ref Range   Sodium 143 135 - 145 mmol/L   Potassium 4.4 3.5 - 5.1 mmol/L   Chloride 111 98 - 111 mmol/L   CO2 21 (L) 22 - 32 mmol/L   Glucose, Bld 105 (H) 70 - 99 mg/dL   BUN 90 (H) 8 - 23 mg/dL   Creatinine, Ser 4.55 (H) 0.44 - 1.00 mg/dL   Calcium 9.0 8.9 - 10.3 mg/dL   GFR calc non Af Amer 8 (L) >60 mL/min   GFR calc Af Amer 9 (L) >60 mL/min   Anion gap 11 5 - 15    Comment: Performed at [facility], [address], [place], [place] [address]   Ct Head Wo Contrast  Result Date: 02/16/2019 CLINICAL DATA:  Multiple recent falls.  LEFT forehead swelling. EXAM: CT HEAD WITHOUT CONTRAST TECHNIQUE: Contiguous axial images were obtained from the base of the skull through the vertex without intravenous contrast. COMPARISON:  CT HEAD February 03, 2019 FINDINGS: BRAIN: No intraparenchymal hemorrhage, mass effect nor midline shift. No parenchymal brain volume loss for age. No hydrocephalus. Patchy supratentorial white matter hypodensities. 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 No acute large vascular territory infarcts. No abnormal extra-axial fluid collections. Basal cisterns are patent. VASCULAR: Moderate calcific atherosclerosis of the carotid siphons. Dolichoectatic intracranial vessels seen with chronic hypertension. SKULL: No skull fracture. 1.6 x 2 cm LEFT frontal complex nodule at site of prior hematoma. SINUSES/ORBITS: Trace paranasal sinus mucosal thickening. Mastoid air cells are well aerated.The included ocular globes and orbital contents are non-suspicious. Status post bilateral ocular lens implants. OTHER:  LEFT periorbital soft tissue swelling without subcutaneous gas or radiopaque foreign bodies. IMPRESSION: 1. No acute intracranial process. 2. 2 cm LEFT frontal complex nodule, likely residual hematoma given site of recent trauma. 3. Otherwise stable examination including moderate chronic small vessel ischemic changes. Electronically Signed   By: Elon Alas M.D.   On: 02/16/2019 05:56    Pending Labs [employer] (From admission, onward)    Start  Ordered   02/16/19 0518  Urinalysis, Routine w reflex microscopic  ONCE - STAT,   STAT     02/16/19 0517   02/16/19 0518  Urine culture  ONCE - STAT,   STAT     02/16/19 0517          Vitals/Pain Today's Vitals   02/16/19 0457  BP: 105/65  Pulse: 66  Resp: 18  Temp: 98.1 F (36.7 C)  TempSrc: Oral  SpO2: 100%  PainSc: 0-No pain    Isolation Precautions No active isolations  Medications Medications  ondansetron (ZOFRAN) injection 4 mg (4 mg Intravenous Not Given 02/16/19 0839)  sodium chloride 0.9 % bolus 500 mL (0 mLs Intravenous Stopped 02/16/19 0801)    Mobility walks with device High fall risk   Focused Assessments Neuro Assessment Handoff:       If patient is a Neuro Trauma and patient is going to OR before floor call report to [place] nurse: [phone] or [phone]     R Recommendations: See Admitting Provider Note  Report given to: 3E 30  Additional Notes:  Admitted for AKI and recurrent falls. Pt alert but has baseline confusion. Walks with walker at nursing facility. Pt from [place]. Creatinine 4.55 and BUN 90 and GFR 9.

## 2019-02-16 NOTE — ED Notes (Signed)
Patient transported to CT 

## 2019-02-16 NOTE — H&P (Signed)
History and Physical    Laura Houston HYW:[id number] DOB: 1923-10-15 DOA: 02/16/2019  Referring MD/NP/PA: EDP PCP:  Patient coming from: [facility]  Chief Complaint: Fall, weakness  HPI: Laura Houston is a 85 y.o. female with medical history significant of CAD, chronic diastolic CHF, chronic kidney disease stage IV, history of pacemaker, GERD, gout, remote history of colon cancer, mild cognitive dysfunction and sundowning per daughter currently residing at [facility] for the last 4 months since her left hip fracture and repair. -She's mostly wheelchair-bound at baseline, except takes a few steps when physical therapy works with her. -Patient is a very poor historian keeps repeating that she wants to go back to SNF, she reportedly fell multiple times this past week, and was brought to the ER following another fall, she was found on the floor beside her bed last night, she was noted to have bruising in her left cheek and mild swelling of her left forehead.  ED Course: Work-up in the emergency room was notable for stable vital signs, worsening kidney function with creatinine of 4.5 and BUN of 90, hemoglobin of 8.3. -X-rays, CT head with chronic findings only  Review of Systems: As per HPI otherwise 14 point review of systems negative.   Past Medical History:  Diagnosis Date  . Bradycardia   . CAD (coronary artery disease)   . Chronic systolic (congestive) heart failure ([place])   . CKD (chronic kidney disease), stage IV ([place])   . Colon cancer [facility])    s/p of colectomy  . GERD (gastroesophageal reflux disease)   . Gout   . Hospice care   . Iron deficiency anemia   . Status post placement of cardiac pacemaker     Past Surgical History:  Procedure Laterality Date  . ABDOMINAL HYSTERECTOMY    . HIP ARTHROPLASTY Left 10/29/2018   Procedure: LEFT HIP HEMIARTHROPLASTY;  Surgeon: Altamese [place]town, MD;  Location: [place];  Service: Orthopedics;  Laterality: Left;  . PARTIAL  COLECTOMY       reports that she has never smoked. She has never used smokeless tobacco. She reports that she does not drink alcohol or use drugs.  Allergies  Allergen Reactions  . Doxycycline Other (See Comments)    On MAR  . Penicillins Other (See Comments)    Has patient had a PCN reaction causing immediate rash, facial/tongue/throat swelling, SOB or lightheadedness with hypotension: Unknown Has patient had a PCN reaction causing severe rash involving mucus membranes or skin necrosis: Unknown Has patient had a PCN reaction that required hospitalization: Unknown Has patient had a PCN reaction occurring within the last 10 years: Unknown If all of the above answers are "NO", then may proceed with Cephalosporin use.   . Sulfonamide Derivatives     Family History  Problem Relation Age of Onset  . AAA (abdominal aortic aneurysm) Mother      Prior to Admission medications   Medication Sig Start Date End Date Taking? Authorizing Provider  acetaminophen (TYLENOL) 500 MG tablet Take 500 mg by mouth every 6 (six) hours as needed for mild pain, moderate pain or headache.    [provider]  acetaminophen (TYLENOL) 500 MG tablet Take 500 mg by mouth at bedtime.    [provider]  allopurinol (ZYLOPRIM) 100 MG tablet Take 100 mg by mouth daily.    [provider]  amLODipine (NORVASC) 5 MG tablet Take 5 mg by mouth daily.    [provider]  aspirin 81 MG chewable  tablet Chew 81 mg by mouth daily.    [provider]  carvedilol (COREG) 12.5 MG tablet Take 12.5 mg by mouth 2 (two) times daily with a meal.    [provider]  cholecalciferol (VITAMIN D) 1000 units tablet Take 1,000 Units by mouth daily.    [provider]  escitalopram (LEXAPRO) 10 MG tablet Take 10 mg by mouth daily.    [provider]  fentaNYL (DURAGESIC - DOSED MCG/HR) 75 MCG/HR Place 1 patch (75 mcg total) onto the skin every 3 (three) days. 11/05/18    Patrecia Pour, MD  ferrous sulfate 325 (65 FE) MG EC tablet Take 325 mg by mouth 2 (two) times daily.     [provider]  furosemide (LASIX) 40 MG tablet Take 40 mg by mouth at bedtime.    [provider]  hyoscyamine (LEVSIN, ANASPAZ) 0.125 MG tablet Take 0.125 mg by mouth every 4 (four) hours as needed (excess secretions).    [provider]  isosorbide mononitrate (IMDUR) 60 MG 24 hr tablet Take 60 mg by mouth daily.    [provider]  lactulose (CHRONULAC) 10 GM/15ML solution Take 30 g by mouth 2 (two) times daily as needed for mild constipation.    [provider]  linaclotide (LINZESS) 145 MCG CAPS capsule Take 145 mcg by mouth daily before breakfast.    [provider]  Melatonin 3 MG TABS Take 6 mg by mouth at bedtime.    [provider]  mineral oil enema Place 1 enema rectally as needed for severe constipation.    [provider]  omeprazole (PRILOSEC) 20 MG capsule Take 20 mg by mouth daily.    [provider]  oxyCODONE (OXY IR/ROXICODONE) 5 MG immediate release tablet Take 1 tablet (5 mg total) by mouth every 4 (four) hours as needed for moderate pain or severe pain. 11/05/18   Patrecia Pour, MD  polyethylene glycol (MIRALAX / Floria Raveling) packet Take 17 g by mouth 2 (two) times daily.     [provider]  senna (SENOKOT) 8.6 MG tablet Take 2 tablets by mouth 2 (two) times daily.     [provider]  traZODone (DESYREL) 100 MG tablet Take 100 mg by mouth at bedtime.    [provider]  vitamin C (ASCORBIC ACID) 500 MG tablet Take 500 mg by mouth daily.    [provider]    Physical Exam: Vitals:   02/16/19 0457  BP: 105/65  Pulse: 66  Resp: 18  Temp: 98.1 F (36.7 C)  TempSrc: Oral  SpO2: 100%      Constitutional: Frail thinly built female, laying in bed, no distress awake alert oriented to self and place only Vitals:   02/16/19 0457  BP: 105/65  Pulse:  66  Resp: 18  Temp: 98.1 F (36.7 C)  TempSrc: Oral  SpO2: 100%   HEENT: Bruising noted in the left side of her face, small hematoma on her forehead Respiratory: Clear bilaterally Cardiovascular: Regular rate and rhythm, no murmurs / rubs / gallops Abdomen: soft, non tender, Bowel sounds positive.  Musculoskeletal: Chronic arthritis changes in both knees Ext: No edema Skin: no rashes, lesions, ulcers.  Neurologic: Moves all extremities, no localizing signs Psychiatric: Appropriate mood and affect  Labs on Admission: I have personally reviewed following labs and imaging studies  CBC: Recent Labs  Lab 02/16/19 0524  WBC 4.3  NEUTROABS 3.3  HGB 8.3*  HCT 27.2*  MCV 95.8  PLT 132*   Basic Metabolic Panel: Recent Labs  Lab 02/16/19 0524  NA 143  K 4.4  CL 111  CO2 21*  GLUCOSE 105*  BUN 90*  CREATININE 4.55*  CALCIUM 9.0   GFR: CrCl cannot be calculated (Unknown ideal weight.). Liver Function Tests: No results for input(s): AST, ALT, ALKPHOS, BILITOT, PROT, ALBUMIN in the last 168 hours. No results for input(s): LIPASE, AMYLASE in the last 168 hours. No results for input(s): AMMONIA in the last 168 hours. Coagulation Profile: No results for input(s): INR, PROTIME in the last 168 hours. Cardiac Enzymes: No results for input(s): CKTOTAL, CKMB, CKMBINDEX, TROPONINI in the last 168 hours. BNP (last 3 results) No results for input(s): PROBNP in the last 8760 hours. HbA1C: No results for input(s): HGBA1C in the last 72 hours. CBG: No results for input(s): GLUCAP in the last 168 hours. Lipid Profile: No results for input(s): CHOL, HDL, LDLCALC, TRIG, CHOLHDL, LDLDIRECT in the last 72 hours. Thyroid Function Tests: No results for input(s): TSH, T4TOTAL, FREET4, T3FREE, THYROIDAB in the last 72 hours. Anemia Panel: No results for input(s): VITAMINB12, FOLATE, FERRITIN, TIBC, IRON, RETICCTPCT in the last 72 hours. Urine analysis:    Component Value Date/Time    COLORURINE YELLOW 02/16/2019 0700   APPEARANCEUR HAZY (A) 02/16/2019 0700   LABSPEC 1.012 02/16/2019 0700   PHURINE 5.0 02/16/2019 0700   GLUCOSEU NEGATIVE 02/16/2019 0700   HGBUR NEGATIVE 02/16/2019 0700   BILIRUBINUR NEGATIVE 02/16/2019 0700   KETONESUR NEGATIVE 02/16/2019 0700   PROTEINUR NEGATIVE 02/16/2019 0700   UROBILINOGEN 0.2 01/24/2011 1324   NITRITE NEGATIVE 02/16/2019 0700   LEUKOCYTESUR NEGATIVE 02/16/2019 0700   Sepsis Labs: @LABRCNTIP (procalcitonin:4,lacticidven:4) )No results found for this or any previous visit (from the past 240 hour(s)).   Radiological Exams on Admission: Ct Head Wo Contrast  Result Date: 02/16/2019 CLINICAL DATA:  Multiple recent falls.  LEFT forehead swelling. EXAM: CT HEAD WITHOUT CONTRAST TECHNIQUE: Contiguous axial images were obtained from the base of the skull through the vertex without intravenous contrast. COMPARISON:  CT HEAD February 03, 2019 FINDINGS: BRAIN: No intraparenchymal hemorrhage, mass effect nor midline shift. No parenchymal brain volume loss for age. No hydrocephalus. Patchy supratentorial white matter hypodensities. 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 No acute large vascular territory infarcts. No abnormal extra-axial fluid collections. Basal cisterns are patent. VASCULAR: Moderate calcific atherosclerosis of the carotid siphons. Dolichoectatic intracranial vessels seen with chronic hypertension. SKULL: No skull fracture. 1.6 x 2 cm LEFT frontal complex nodule at site of prior hematoma. SINUSES/ORBITS: Trace paranasal sinus mucosal thickening. Mastoid air cells are well aerated.The included ocular globes and orbital contents are non-suspicious. Status post bilateral ocular lens implants. OTHER: LEFT periorbital soft tissue swelling without subcutaneous gas or radiopaque foreign bodies. IMPRESSION: 1. No acute intracranial process. 2. 2 cm LEFT frontal complex nodule, likely residual hematoma given site  of recent trauma. 3. Otherwise stable examination including moderate chronic small vessel ischemic changes. Electronically Signed   By: Elon Alas M.D.   On: 02/16/2019 05:56    EKG: Independently reviewed.   Assessment/Plan Principal Problem:  AKI (acute kidney injury) ([place]) -In the background of chronic kidney disease stage IV with baseline creatinine around 2.5 -Given significant worsening of BUN as well suspect a prerenal component to this -Hold Lasix, hydrate with IV fluids today -Check renal ultrasound -Also check CK given frequent falls -Discussed with the daughter/POA and she agrees that patient would not be a dialysis candidate should her  kidney function not improve -She has been seen by palliative care a few months ago, will benefit from a repeat consult if creatinine does not improve   Normocytic anemia -Hemoglobin at baseline, likely has anemia from chronic kidney disease -Expect this to trend down with hydration -Follow  Chronic pain -Patient is on a regimen of high-dose fentanyl patch at 75 mcg and oxycodone as needed -I have decreased fentanyl patch dose to 25 mcg  Chronic diastolic CHF -Currently dry with AKI, monitor with hydration, continue Coreg  Severe protein calorie malnutrition -Supplements as tolerated  CAD -stable  Frequent falls -Xrays unremarable -CT head w/ chronic changes -PT eval, decrease fentanyl patch dose  Goals: This is a chronically ill debilitated mostly bedbound SNF resident with ongoing failure to thrive, frequent falls, now worsening AKI -She is not a dialysis candidate should kidney function not improve daughter agrees to this, DNR -Will need palliative follow-up at SNF  DVT prophylaxis: Heparin subcutaneous Code Status: DNR Family Communication: Daughter and POA at bedside Disposition Plan: Back to SNF when improved Consults called: None Admission status: Inpatient  Domenic Polite MD [facility]  02/16/2019,  9:12 AM

## 2019-02-16 NOTE — ED Notes (Signed)
Attending at bedside.

## 2019-02-17 LAB — BASIC METABOLIC PANEL
Anion gap: 8 (ref 5–15)
BUN: 72 mg/dL — AB (ref 8–23)
CO2: 20 mmol/L — ABNORMAL LOW (ref 22–32)
Calcium: 9 mg/dL (ref 8.9–10.3)
Chloride: 115 mmol/L — ABNORMAL HIGH (ref 98–111)
Creatinine, Ser: 3.44 mg/dL — ABNORMAL HIGH (ref 0.44–1.00)
GFR calc Af Amer: 13 mL/min — ABNORMAL LOW (ref >60)
GFR, EST NON AFRICAN AMERICAN: 11 mL/min — AB (ref >60)
Glucose, Bld: 96 mg/dL (ref 70–99)
POTASSIUM: 4 mmol/L (ref 3.5–5.1)
Sodium: 143 mmol/L (ref 135–145)

## 2019-02-17 LAB — CBC
HCT: 28.6 % — ABNORMAL LOW (ref 36.0–46.0)
Hemoglobin: 8.6 g/dL — ABNORMAL LOW (ref 12.0–15.0)
MCH: 28 pg (ref 26.0–34.0)
MCHC: 30.1 g/dL (ref 30.0–36.0)
MCV: 93.2 fL (ref 80.0–100.0)
PLATELETS: 134 10*3/uL — AB (ref 150–400)
RBC: 3.07 MIL/uL — ABNORMAL LOW (ref 3.87–5.11)
RDW: 15.9 % — ABNORMAL HIGH (ref 11.5–15.5)
WBC: 3.5 10*3/uL — ABNORMAL LOW (ref 4.0–10.5)
nRBC: 0 % (ref 0.0–0.2)

## 2019-02-17 LAB — URINE CULTURE

## 2019-02-17 MED ORDER — ADULT MULTIVITAMIN W/MINERALS CH
1.0000 | ORAL_TABLET | Freq: Every day | ORAL | Status: DC
  Administered 2019-02-17 – 2019-02-18 (×2): 1 via ORAL
  Filled 2019-02-17 (×2): qty 1

## 2019-02-17 MED ORDER — PRO-STAT SUGAR FREE PO LIQD
30.0000 mL | Freq: Two times a day (BID) | ORAL | Status: DC
  Administered 2019-02-17 – 2019-02-18 (×2): 30 mL via ORAL
  Filled 2019-02-17 (×2): qty 30

## 2019-02-17 MED ORDER — FENTANYL 25 MCG/HR TD PT72
1.0000 | MEDICATED_PATCH | TRANSDERMAL | Status: DC
  Administered 2019-02-17: 1 via TRANSDERMAL
  Filled 2019-02-17: qty 1

## 2019-02-17 MED ORDER — FENTANYL 75 MCG/HR TD PT72
1.0000 | MEDICATED_PATCH | TRANSDERMAL | Status: DC
  Administered 2019-02-17: 1 via TRANSDERMAL
  Filled 2019-02-17: qty 1

## 2019-02-17 NOTE — Clinical Social Work Note (Signed)
Clinical Social Work Assessment  Patient Details  Name: Laura Houston MRN: [id number] Date of Birth: [birth year]  Date of referral:  02/17/19               Reason for consult:  Discharge Planning                Permission sought to share information with:  Facility Sport and exercise psychologist, Family Supports Permission granted to share information::  Yes, Verbal Permission Granted  Name::     Laura Houston  Agency::  [facility]  Relationship::  Daughter  Contact Information:  [phone]  Housing/Transportation Living arrangements for the past 2 months:  [place] of Information:  Patient, Medical Team, Adult Children, Facility Patient Interpreter Needed:  None Criminal Activity/Legal Involvement Pertinent to Current Situation/Hospitalization:  No - Comment as needed Significant Relationships:  Adult Children Lives with:  Facility Resident Do you feel safe going back to the place where you live?  Yes Need for family participation in patient care:  Yes (Comment)  Care giving concerns:  Patient is a long-term resident at [facility].   Social Worker assessment / plan:  CSW met with patient. No supports at bedside. CSW introduced role and explained that discharge planning would be discussed. Patient confirmed she is a long-term resident at [facility] and plans to return at discharge. Inquired about care concerns during third shift at the SNF (per RN consult). Patient would not go into detail but when CSW asked what had been going on she just replied "a lot." Patient gave CSW permission to discuss with her daughter. Daughter stated patient typically sleeps during the day and is awake at night so that is when most of her accidents occur. Daughter would like if someone at the SNF would just check on her every 30 minutes or so during night shift to make sure she's still in bed. SNF admissions coordinator will discuss with DON. No further concerns. CSW encouraged  patient and her daughter to contact CSW as needed. CSW will continue to follow patient and her daughter for support and facilitate discharge back to SNF once medically stable.  Employment status:  Retired Nurse, adult PT Recommendations:  [place] / Referral to community resources:  [place]  Patient/Family's Response to care:  Patient and her daughter agreeable to return to SNF. Patient's daughter supportive and involved in patient's care. Patient and her daughter appreciated social work intervention.  Patient/Family's Understanding of and Emotional Response to Diagnosis, Current Treatment, and Prognosis:  Patient and her daughter have a good understanding of the reason for admission and plan to return to SNF once stable for discharge. Patient and her daughter appear happy with hospital care.  Emotional Assessment Appearance:  Appears stated age Attitude/Demeanor/Rapport:  Engaged, Gracious Affect (typically observed):  Accepting, Appropriate, Calm, Pleasant Orientation:  Oriented to Self, Oriented to Place, Oriented to  Time, Oriented to Situation Alcohol / Substance use:  Never Used Psych involvement (Current and /or in the community):  No (Comment)  Discharge Needs  Concerns to be addressed:  Care Coordination Readmission within the last 30 days:  No Current discharge risk:  None Barriers to Discharge:  Continued Medical Work up   Laura Chroman, LCSW 02/17/2019, 12:55 PM

## 2019-02-17 NOTE — Plan of Care (Signed)
  Problem: Activity: Goal: Risk for activity intolerance will decrease Outcome: Progressing   Problem: Safety: Goal: Ability to remain free from injury will improve Outcome: Progressing   

## 2019-02-17 NOTE — Evaluation (Addendum)
Occupational Therapy Evaluation Patient Details Name: Laura Houston MRN: [id number] DOB: 1923-02-27 Today's Date: 02/17/2019    History of Present Illness 85 y.o. female with medical history significant of CAD, chronic diastolic CHF, chronic kidney disease stage IV, history of pacemaker, GERD, gout, remote history of colon cancer, mild cognitive dysfunction and sundowning per daughter currently residing at [facility] for the last 4 months since recent left hip fracture and repair (10/2018). Pt now presents from facility with reports of multiple falls this past week, was brought to the ER following another fall.    Clinical Impression   This 85 y/o female presents with the above. PTA pt residing in ALF, was mostly using wheelchair for mobility with assist for transfers, receiving assist from staff for ADL completion. Pt presents supine in bed pleasant and given min encouragement agreeable to working with therapy. Pt requiring minA for room/hallway level mobility using RW (+2 safety/equipment); currently requires setup/minguard assist for seated UB ADL, mod-maxA for LB ADL. Pt will benefit from continued acute OT services and recommend follow up therapy services at SNF level after discharge to maximize her safety and independence with ADL and mobility. Will follow.     Follow Up Recommendations  SNF;Supervision/Assistance - 24 hour    Equipment Recommendations  None recommended by OT           Precautions / Restrictions Precautions Precautions: Fall Restrictions Weight Bearing Restrictions: No      Mobility Bed Mobility Overal bed mobility: Needs Assistance             General bed mobility comments: seated EOB with PT upon arrival  Transfers Overall transfer level: Needs assistance Equipment used: Rolling walker (2 wheeled) Transfers: Sit to/from Stand Sit to Stand: Min assist;Mod assist;+2 safety/equipment         General transfer comment: VCs hand placement  with boosting assist to rise and steady at RW, assist to control descent    Balance Overall balance assessment: Needs assistance;History of Falls Sitting-balance support: Feet supported Sitting balance-Leahy Scale: Good     Standing balance support: Bilateral upper extremity supported Standing balance-Leahy Scale: Poor                             ADL either performed or assessed with clinical judgement   ADL Overall ADL's : Needs assistance/impaired Eating/Feeding: Set up;Sitting Eating/Feeding Details (indicate cue type and reason): assist to open containers, pour drinks, etc Grooming: Wash/dry face;Set up;Sitting   Upper Body Bathing: Min guard;Set up;Sitting   Lower Body Bathing: Moderate assistance;Sit to/from stand   Upper Body Dressing : Min guard;Set up;Sitting   Lower Body Dressing: Moderate assistance;Maximal assistance;Sit to/from stand   Toilet Transfer: +2 for safety/equipment;Ambulation;RW;Minimal assistance;Moderate assistance Toilet Transfer Details (indicate cue type and reason): simulated via transfer to recliner, room/hallway level mobility Toileting- Clothing Manipulation and Hygiene: Moderate assistance;Sit to/from stand       Functional mobility during ADLs: Minimal assistance;Moderate assistance;Rolling walker General ADL Comments: pt mostly limited due to weakness and fatigue; able to complete mobility during session short distance into hallway, requires seated rest break during activity; setup assist provided for seated ADL     Vision         Perception     Praxis      Pertinent Vitals/Pain Pain Assessment: Faces Faces Pain Scale: Hurts little more Pain Location: L hip, bil heels Pain Descriptors / Indicators: Sore;Tender Pain Intervention(s): Limited activity within patient's  tolerance;Monitored during session;Repositioned     Hand Dominance     Extremity/Trunk Assessment Upper Extremity Assessment Upper Extremity  Assessment: Generalized weakness   Lower Extremity Assessment Lower Extremity Assessment: Defer to PT evaluation   Cervical / Trunk Assessment Cervical / Trunk Assessment: Kyphotic   Communication Communication Communication: No difficulties   Cognition Arousal/Alertness: Awake/alert Behavior During Therapy: WFL for tasks assessed/performed Overall Cognitive Status: History of cognitive impairments - at baseline                                 General Comments: pt with hx of dementia, pleasant and cooperative throughout, appears to be accurate historian for basic PLOF info    General Comments  daughter present at end of session    Exercises     Shoulder Instructions      Home Living Family/patient expects to be discharged to:: Assisted living(per daughter in ALF side of nursing facility)                             Home Equipment: Walker - 2 wheels          Prior Functioning/Environment Level of Independence: Needs assistance  Gait / Transfers Assistance Needed: mostly wheelchair level, can use RW with assist ADL's / Homemaking Assistance Needed: reports staff assist with all ADL            OT Problem List: Decreased strength;Decreased range of motion;Decreased activity tolerance;Impaired balance (sitting and/or standing);Decreased knowledge of use of DME or AE;Decreased cognition      OT Treatment/Interventions: Therapeutic exercise;Self-care/ADL training;Neuromuscular education;DME and/or AE instruction;Therapeutic activities;Patient/family education;Balance training;Cognitive remediation/compensation    OT Goals(Current goals can be found in the care plan section) Acute Rehab OT Goals Patient Stated Goal: walk more OT Goal Formulation: With patient Time For Goal Achievement: 03/03/19 Potential to Achieve Goals: Good  OT Frequency: Min 2X/week   Barriers to D/C:            Co-evaluation PT/OT/SLP Co-Evaluation/Treatment:  Yes Reason for Co-Treatment: For patient/therapist safety;To address functional/ADL transfers   OT goals addressed during session: ADL's and self-care;Proper use of Adaptive equipment and DME      AM-PAC OT "6 Clicks" Daily Activity     Outcome Measure Help from another person eating meals?: A Little Help from another person taking care of personal grooming?: A Little Help from another person toileting, which includes using toliet, bedpan, or urinal?: A Lot Help from another person bathing (including washing, rinsing, drying)?: A Lot Help from another person to put on and taking off regular upper body clothing?: A Little Help from another person to put on and taking off regular lower body clothing?: A Lot 6 Click Score: 15   End of Session Equipment Utilized During Treatment: Gait belt;Rolling walker Nurse Communication: Mobility status  Activity Tolerance: Patient tolerated treatment well Patient left: in chair;with call bell/phone within reach;with chair alarm set;with family/visitor present  OT Visit Diagnosis: Unsteadiness on feet (R26.81);Muscle weakness (generalized) (M62.81);History of falling (Z91.81)                Time: 7741-2878 OT Time Calculation (min): 24 min Charges:  OT General Charges $OT Visit: 1 Visit OT Evaluation $OT Eval Moderate Complexity: 1 Mod  Lou Cal, OT [employer] Pager [phone] Office [phone]   Raymondo Band 02/17/2019, 10:03 AM

## 2019-02-17 NOTE — Progress Notes (Addendum)
Initial Nutrition Assessment  DOCUMENTATION CODES:   Underweight, Severe malnutrition in context of chronic illness  INTERVENTION:   -30 ml Prostat BID, each supplement provides 100 kcals and 15 grams protein -Magic cup TID with meals, each supplement provides 290 kcal and 9 grams of protein -MVI with minerals daily  NUTRITION DIAGNOSIS:   Severe Malnutrition related to chronic illness(CHF, CKD IV) as evidenced by energy intake < or equal to 75% for > or equal to 1 month, severe fat depletion, severe muscle depletion, percent weight loss.  GOAL:   Patient will meet greater than or equal to 90% of their needs  MONITOR:   PO intake, Supplement acceptance, Labs, Weight trends, Skin, I & O's  REASON FOR ASSESSMENT:   Malnutrition Screening Tool    ASSESSMENT:   Laura Houston is a 85 y.o. female with medical history significant of CAD, chronic diastolic CHF, chronic kidney disease stage IV, history of pacemaker, GERD, gout, remote history of colon cancer, mild cognitive dysfunction and sundowning per daughter currently residing at [facility] for the last 4 months since her left hip fracture and repair.  Pt admitted with AKI.  Reviewed I/O's: +1.4 L since admission  Case discussed with RN prior to visit, who reports pt consumed a good breakfast this AM, but is currently very sleepy.   Pt lethargic at time of visit, reporting "I just want to sleep". Spoke with pt's close friend at bedside, who reports that she has lived in SNF for approximately 2 years and has experienced a general decline in health since then. Pt has been eating very poorly, mainly bites and sips at meals. Her friend visits her almost daily at meal times and confirms this. She ate most of her breakfast this morning per RN, but ate only a few bites of lunch.   Reviewed records from [facility]. Pt takes MVI and Magic Cups at each meal. Pt and friend confirm she is not on any modified diet at home.    Reviewed wt hx; noted pt has experienced a 11% wt loss over the past 3 months, which is significant for time frame. RD to use admission wt of 53.2 kg, as suspect most recent wt is inaccurate. RD unable to confirm wt due to current bed.   Per MD notes, pt daughter does not desire further treatments for renal mass or renal failure (pt is not an HD candidate). Plan to transition back to SNF with hospice services.   Labs reviewed.   NUTRITION - FOCUSED PHYSICAL EXAM:    Most Recent Value  Orbital Region  Severe depletion  Upper Arm Region  Severe depletion  Thoracic and Lumbar Region  Severe depletion  Buccal Region  Moderate depletion  Temple Region  Severe depletion  Clavicle Bone Region  Severe depletion  Clavicle and Acromion Bone Region  Severe depletion  Scapular Bone Region  Severe depletion  Dorsal Hand  Severe depletion  Patellar Region  Moderate depletion  Anterior Thigh Region  Moderate depletion  Posterior Calf Region  Moderate depletion  Edema (RD Assessment)  None  Hair  Reviewed  Eyes  Reviewed  Mouth  Reviewed  Skin  Reviewed  Nails  Reviewed       Diet Order:   Diet Order            Diet renal with fluid restriction Fluid restriction: Other (see comments); Room service appropriate? Yes; Fluid consistency: Thin  Diet effective now  EDUCATION NEEDS:   No education needs have been identified at this time  Skin:  Skin Assessment: Skin Integrity Issues: Skin Integrity Issues:: DTI, Stage II, Stage III DTI: lt heel, lt anterior toe Stage II: sacrum Stage III: sacrum  Last BM:  02/15/19  Height:   Ht Readings from Last 1 Encounters:  02/16/19 5\' 10"  (1.778 m)    Weight:   Wt Readings from Last 1 Encounters:  02/17/19 26.3 kg    Ideal Body Weight:  72.7 kg  BMI:  Body mass index is 8.32 kg/m.  Estimated Nutritional Needs:   Kcal:  1400-1600  Protein:  65-80 grams  Fluid:  1.4-1.6 L    Analese Sovine A. Jimmye Norman, RD, LDN,  [place] Registered Dietitian II Certified Diabetes Care and Education Specialist Pager: [phone] After hours Pager: [phone]

## 2019-02-17 NOTE — Evaluation (Signed)
Physical Therapy Evaluation Patient Details Name: Laura Houston MRN: [id number] DOB: [birth year] Today's Date: 02/17/2019   History of Present Illness  85 y.o. female with medical history significant of CAD, chronic diastolic CHF, chronic kidney disease stage IV, history of pacemaker, GERD, gout, remote history of colon cancer, mild cognitive dysfunction and sundowning per daughter currently residing at [facility] for the last 4 months since recent left hip fracture and repair (10/2018). Pt now presents from facility with reports of multiple falls this past week, was brought to the ER following another fall.     Clinical Impression  Pt admitted with above diagnosis. Pt currently with functional limitations due to the deficits listed below (see PT Problem List). On eval, pt required mod assist bed mobility, mod assist sit to stand, and min assist +2 safety for ambulation 25 feet x 2 with RW. Pt will benefit from skilled PT to increase their independence and safety with mobility to allow discharge to the venue listed below.       Follow Up Recommendations SNF    Equipment Recommendations  None recommended by PT    Recommendations for Other Services       Precautions / Restrictions Precautions Precautions: Fall Restrictions Weight Bearing Restrictions: No      Mobility  Bed Mobility Overal bed mobility: Needs Assistance Bed Mobility: Supine to Sit     Supine to sit: Mod assist;HOB elevated     General bed mobility comments: +rail, assist to elevate trunk and scoot to EOB  Transfers Overall transfer level: Needs assistance Equipment used: Rolling walker (2 wheeled) Transfers: Sit to/from Stand Sit to Stand: Mod assist;+2 safety/equipment         General transfer comment: VCs hand placement with boosting assist to rise and steady at [place], assist to control descent  Ambulation/Gait Ambulation/Gait assistance: Min assist;+2 safety/equipment Gait Distance (Feet): 25  Feet(x 2) Assistive device: Rolling walker (2 wheeled) Gait Pattern/deviations: Step-through pattern;Decreased stride length;Trunk flexed Gait velocity: decreased Gait velocity interpretation: <1.8 ft/sec, indicate of risk for recurrent falls General Gait Details: assist to maintain balance, cues for posture/look up and keep safe distance from front of RW  Stairs            Wheelchair Mobility    Modified Rankin (Stroke Patients Only)       Balance Overall balance assessment: Needs assistance;History of Falls Sitting-balance support: Feet supported;No upper extremity supported Sitting balance-Leahy Scale: Good     Standing balance support: Bilateral upper extremity supported;During functional activity Standing balance-Leahy Scale: Poor                               Pertinent Vitals/Pain Pain Assessment: Faces Faces Pain Scale: Hurts little more Pain Location: L hip, bil heels Pain Descriptors / Indicators: Sore;Tender Pain Intervention(s): Limited activity within patient's tolerance;Monitored during session;Repositioned    Home Living Family/patient expects to be discharged to:: Assisted living(per daughter, in ALF side of nursing facility)               Home Equipment: Walker - 2 wheels;Wheelchair - manual      Prior Function Level of Independence: Needs assistance   Gait / Transfers Assistance Needed: mostly wheelchair level, can use RW with assist  ADL's / Homemaking Assistance Needed: reports staff assist with all ADL        Hand Dominance        Extremity/Trunk Assessment   Upper Extremity  Assessment Upper Extremity Assessment: Generalized weakness    Lower Extremity Assessment Lower Extremity Assessment: Generalized weakness    Cervical / Trunk Assessment Cervical / Trunk Assessment: Kyphotic  Communication   Communication: No difficulties  Cognition Arousal/Alertness: Awake/alert Behavior During Therapy: WFL for tasks  assessed/performed Overall Cognitive Status: History of cognitive impairments - at baseline                                 General Comments: pt with hx of dementia, pleasant and cooperative throughout, appears to be accurate historian for basic PLOF info       General Comments General comments (skin integrity, edema, etc.): daughter present at end of session    Exercises     Assessment/Plan    PT Assessment Patient needs continued PT services  PT Problem List Decreased strength;Decreased balance;Decreased cognition;Pain;Decreased mobility;Decreased safety awareness;Decreased activity tolerance       PT Treatment Interventions Functional mobility training;Balance training;Patient/family education;Gait training;Therapeutic activities;Therapeutic exercise    PT Goals (Current goals can be found in the Care Plan section)  Acute Rehab PT Goals Patient Stated Goal: walk more PT Goal Formulation: With patient/family Time For Goal Achievement: 03/03/19 Potential to Achieve Goals: Good    Frequency Min 2X/week   Barriers to discharge        Co-evaluation PT/OT/SLP Co-Evaluation/Treatment: Yes Reason for Co-Treatment: For patient/therapist safety;To address functional/ADL transfers PT goals addressed during session: Mobility/safety with mobility;Balance OT goals addressed during session: ADL's and self-care;Proper use of Adaptive equipment and DME       AM-PAC PT "6 Clicks" Mobility  Outcome Measure Help needed turning from your back to your side while in a flat bed without using bedrails?: A Little Help needed moving from lying on your back to sitting on the side of a flat bed without using bedrails?: A Lot Help needed moving to and from a bed to a chair (including a wheelchair)?: A Little Help needed standing up from a chair using your arms (e.g., wheelchair or bedside chair)?: A Lot Help needed to walk in hospital room?: A Little Help needed climbing 3-5  steps with a railing? : A Lot 6 Click Score: 15    End of Session Equipment Utilized During Treatment: Gait belt Activity Tolerance: Patient tolerated treatment well Patient left: in chair;with call bell/phone within reach;with chair alarm set;with family/visitor present Nurse Communication: Mobility status PT Visit Diagnosis: Other abnormalities of gait and mobility (R26.89);History of falling (Z91.81);Difficulty in walking, not elsewhere classified (R26.2);Pain    Time: 2505-3976 PT Time Calculation (min) (ACUTE ONLY): 24 min   Charges:   PT Evaluation $PT Eval Moderate Complexity: 1 Mod          Lorrin Goodell, PT  Office # [phone] Pager [phone]   Lorriane Shire 02/17/2019, 10:40 AM

## 2019-02-17 NOTE — Progress Notes (Signed)
PROGRESS NOTE    Laura Houston  JQB:[id number] DOB: 1923/02/01 DOA: 02/16/2019 PCP: Patient, No Pcp Per  Brief Narrative: HPI: Laura Houston is a 85 y.o. female with medical history significant of CAD, chronic diastolic CHF, chronic kidney disease stage IV, history of pacemaker, GERD, gout, remote history of colon cancer, mild cognitive dysfunction and sundowning per daughter currently residing at [facility] for the last 4 months since her left hip fracture and repair. -She's mostly wheelchair-bound at baseline, except takes a few steps when physical therapy works with her. -she reportedly fell multiple times this past week, and was brought to the ER following another fall, she was found on the floor beside her bed last night, she was noted to have bruising in her left cheek and mild swelling of her left forehead. Found to have worsening kidney function with creatinine of 4.5 and BUN of 90, hemoglobin of 8.3. -CT head with chronic findings only   Assessment & Plan:   AKI (acute kidney injury) ([place]) -In the background of chronic kidney disease stage IV with baseline creatinine around 2.5 -Prerenal, improving with hydration, continue IV fluids today cut down rate to 75 cc an hour -Lasix on hold -Renal ultrasound without hydronephrosis, right renal mass concerning for RCC noted -Be met in a.m.  Right renal mass -Suspicious for renal cell carcinoma -Discussed about this with daughter who reports that family has known about mass in her kidneys for 6 to 8 months, due to advanced age debility and chronic illnesses no further work-up was pursued -Discussed with daughter again, agreed that no further work-up is indicated given advanced age and frailty  Normocytic anemia -Hemoglobin at baseline, likely has anemia from chronic kidney disease -Stable,  Chronic pain -Patient is on a regimen of high-dose fentanyl patch at 75 mcg and oxycodone as needed -I have decreased fentanyl patch  dose to 25 mcg  Chronic diastolic CHF -Currently dry with AKI, monitor with hydration, continue Coreg  Severe protein calorie malnutrition -Supplements as tolerated  CAD -stable  Frequent falls -Xrays unremarable -CT head w/ chronic changes -PT eval, decrease fentanyl patch dose  Goals: This is a chronically ill debilitated mostly bedbound SNF resident with ongoing failure to thrive, frequent falls, now worsening AKI, suspected renal cell carcinoma -She is not a dialysis candidate  -Discussed overall poor prognosis with daughter, patient has had hospice services in the past, daughter agreeable to resume hospice at SNF  DVT prophylaxis: Heparin subcutaneous Code Status: DNR Family Communication:  Called and discussed with daughter  Disposition Plan:  SNF in 1 to 2 days   Procedures:   Antimicrobials:    Subjective: -Feels okay, wants to go back to [facility]  Objective: Vitals:   02/17/19 0014 02/17/19 0340 02/17/19 0414 02/17/19 1208  BP: 137/62  123/87 140/64  Pulse: 61  60 60  Resp: '18  18 18  ' Temp: 98.9 F (37.2 C)  (!) 97.5 F (36.4 C) 97.8 F (36.6 C)  TempSrc: Oral  Oral Oral  SpO2: 100%  90% 100%  Weight:  26.3 kg    Height:        Intake/Output Summary (Last 24 hours) at 02/17/2019 1321 Last data filed at 02/17/2019 0700 Gross per 24 hour  Intake 1297.69 ml  Output 450 ml  Net 847.69 ml   Filed Weights   02/16/19 0940 02/17/19 0340  Weight: 53.2 kg 26.3 kg    Examination:  General exam: Frail chronically ill-appearing, awake alert oriented to self and  place Respiratory system: Clear to auscultation. Respiratory effort normal. Cardiovascular system: S1 & S2 heard, RRR. No JVD, murmurs, rubs, gallops Gastrointestinal system: Abdomen is nondistended, soft and nontender.Normal bowel sounds heard. Central nervous system: Alert and oriented x2. No focal neurological deficits. Extremities: No edema Skin: No rashes, lesions or ulcers Psychiatry:   Mood & affect appropriate.     Data Reviewed:   CBC: Recent Labs  Lab 02/16/19 0524 02/16/19 1011 02/17/19 0427  WBC 4.3 4.5 3.5*  NEUTROABS 3.3  --   --   HGB 8.3* 8.3* 8.6*  HCT 27.2* 27.0* 28.6*  MCV 95.8 93.1 93.2  PLT 137* 139* 283*   Basic Metabolic Panel: Recent Labs  Lab 02/16/19 0524 02/16/19 1011 02/17/19 0427  NA 143  --  143  K 4.4  --  4.0  CL 111  --  115*  CO2 21*  --  20*  GLUCOSE 105*  --  96  BUN 90*  --  72*  CREATININE 4.55* 4.20* 3.44*  CALCIUM 9.0  --  9.0   GFR: Estimated Creatinine Clearance: 4.2 mL/min (A) (by C-G formula based on SCr of 3.44 mg/dL (H)). Liver Function Tests: No results for input(s): AST, ALT, ALKPHOS, BILITOT, PROT, ALBUMIN in the last 168 hours. No results for input(s): LIPASE, AMYLASE in the last 168 hours. No results for input(s): AMMONIA in the last 168 hours. Coagulation Profile: No results for input(s): INR, PROTIME in the last 168 hours. Cardiac Enzymes: Recent Labs  Lab 02/16/19 0524  CKTOTAL 88   BNP (last 3 results) No results for input(s): PROBNP in the last 8760 hours. HbA1C: No results for input(s): HGBA1C in the last 72 hours. CBG: No results for input(s): GLUCAP in the last 168 hours. Lipid Profile: No results for input(s): CHOL, HDL, LDLCALC, TRIG, CHOLHDL, LDLDIRECT in the last 72 hours. Thyroid Function Tests: No results for input(s): TSH, T4TOTAL, FREET4, T3FREE, THYROIDAB in the last 72 hours. Anemia Panel: No results for input(s): VITAMINB12, FOLATE, FERRITIN, TIBC, IRON, RETICCTPCT in the last 72 hours. Urine analysis:    Component Value Date/Time   COLORURINE YELLOW 02/16/2019 0700   APPEARANCEUR HAZY (A) 02/16/2019 0700   LABSPEC 1.012 02/16/2019 0700   PHURINE 5.0 02/16/2019 0700   GLUCOSEU NEGATIVE 02/16/2019 0700   HGBUR NEGATIVE 02/16/2019 0700   BILIRUBINUR NEGATIVE 02/16/2019 0700   KETONESUR NEGATIVE 02/16/2019 0700   PROTEINUR NEGATIVE 02/16/2019 0700   UROBILINOGEN 0.2  01/24/2011 1324   NITRITE NEGATIVE 02/16/2019 0700   LEUKOCYTESUR NEGATIVE 02/16/2019 0700   Sepsis Labs: '@LABRCNTIP' (procalcitonin:4,lacticidven:4)  ) Recent Results (from the past 240 hour(s))  Urine culture     Status: Abnormal   Collection Time: 02/16/19  7:00 AM  Result Value Ref Range Status   Specimen Description URINE, CLEAN CATCH  Final   Special Requests   Final    NONE Performed at [facility], [place] [address], [place], [place] [address]    Culture MULTIPLE SPECIES PRESENT, SUGGEST RECOLLECTION (A)  Final   Report Status 02/17/2019 FINAL  Final  MRSA PCR Screening     Status: None   Collection Time: 02/16/19  9:28 AM  Result Value Ref Range Status   MRSA by PCR NEGATIVE NEGATIVE Final    Comment:        The GeneXpert MRSA Assay (FDA approved for NASAL specimens only), is one component of a comprehensive MRSA colonization surveillance program. It is not intended to diagnose MRSA infection nor to guide or monitor  treatment for MRSA infections. Performed at [facility], [place] [address] [place], [place] [address]          Radiology Studies: Ct Head Wo Contrast  Result Date: 02/16/2019 CLINICAL DATA:  Multiple recent falls.  LEFT forehead swelling. EXAM: CT HEAD WITHOUT CONTRAST TECHNIQUE: Contiguous axial images were obtained from the base of the skull through the vertex without intravenous contrast. COMPARISON:  CT HEAD February 03, 2019 FINDINGS: BRAIN: No intraparenchymal hemorrhage, mass effect nor midline shift. No parenchymal brain volume loss for age. No hydrocephalus. Patchy supratentorial white matter hypodensities. 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 No acute large vascular territory infarcts. No abnormal extra-axial fluid collections. Basal cisterns are patent. VASCULAR: Moderate calcific atherosclerosis of the carotid siphons. Dolichoectatic intracranial vessels seen with chronic hypertension. SKULL: No  skull fracture. 1.6 x 2 cm LEFT frontal complex nodule at site of prior hematoma. SINUSES/ORBITS: Trace paranasal sinus mucosal thickening. Mastoid air cells are well aerated.The included ocular globes and orbital contents are non-suspicious. Status post bilateral ocular lens implants. OTHER: LEFT periorbital soft tissue swelling without subcutaneous gas or radiopaque foreign bodies. IMPRESSION: 1. No acute intracranial process. 2. 2 cm LEFT frontal complex nodule, likely residual hematoma given site of recent trauma. 3. Otherwise stable examination including moderate chronic small vessel ischemic changes. Electronically Signed   By: Elon Alas M.D.   On: 02/16/2019 05:56   US Renal  Result Date: 02/16/2019 CLINICAL DATA:  Acute on chronic kidney injury EXAM: RENAL / URINARY TRACT ULTRASOUND COMPLETE COMPARISON:  11/02/2018 renal sonogram FINDINGS: Right Kidney: Renal measurements: 11.6 x 4.2 x 5.1 cm = volume: 127 mL. Solid 5.4 x 4.4 x 4.1 cm lower right renal mass, previously 3.9 x 3.9 x 3.8 cm. Echogenic right renal parenchyma. No right hydronephrosis. Left Kidney: Left kidney could not be visualized on this scan presumably due to overlying rib and bowel gas shadowing and patient body habitus. Bladder: Relatively collapsed and grossly normal bladder. IMPRESSION: 1. Solid 5.4 cm lower right renal mass, apparently increased in size from 11/02/2018 sonogram, for which renal cell carcinoma is the diagnosis of exclusion. Dedicated renal mass protocol MRI or CT abdomen without and with IV contrast is indicated for further characterization, and may be performed as clinically warranted. 2. No right hydronephrosis. 3. Echogenic right renal parenchyma indicative of nonspecific renal parenchymal disease of uncertain chronicity. 4. Nonvisualization of the left kidney due to scan limitations as detailed. The left kidney could also be evaluated on a renal mass protocol CT or MR as recommended above. 5. Relatively  collapsed and grossly normal bladder. Electronically Signed   By: Ilona Sorrel M.D.   On: 02/16/2019 15:23        Scheduled Meds: . acetaminophen  500 mg Oral QHS  . allopurinol  100 mg Oral Daily  . aspirin  81 mg Oral Daily  . carvedilol  12.5 mg Oral BID WC  . escitalopram  10 mg Oral Daily  . fentaNYL  1 patch Transdermal Q72H  . heparin  5,000 Units Subcutaneous Q8H  . linaclotide  145 mcg Oral QAC breakfast  . pantoprazole  40 mg Oral Daily  . polyethylene glycol  17 g Oral BID  . senna  2 tablet Oral BID   Continuous Infusions: . sodium chloride 75 mL/hr at 02/17/19 1121     LOS: 1 day    Time spent: 9mn    PDomenic Polite MD [facility]   02/17/2019, 1:21 PM

## 2019-02-17 NOTE — NC FL2 (Signed)
[place] LEVEL OF CARE SCREENING TOOL     IDENTIFICATION  Patient Name: Laura Houston Birthdate: 02/21/1923 Sex: female Admission Date (Current Location): 02/16/2019  [facility] and [place] Number:  Herbalist and Address:  [facility], [place] [address], [place],  [address]      Provider Number: [id number]  Attending Physician Name and Address:  Domenic Polite, MD  Relative Name and Phone Number:       Current Level of Care: Hospital Recommended Level of Care: [place] Prior Approval Number:    Date Approved/Denied:   PASRR Number: [id number]  Discharge Plan: SNF    Current Diagnoses: Patient Active Problem List   Diagnosis Date Noted  . AKI (acute kidney injury) ([place]) 02/16/2019  . Fall 10/29/2018  . Hyperkalemia 10/29/2018  . Closed displaced fracture of left femoral neck ([place]) 10/29/2018  . Protein-calorie malnutrition, severe 10/29/2018  . Status post placement of cardiac pacemaker   . Iron deficiency anemia   . Hospice care   . Gout   . GERD (gastroesophageal reflux disease)   . CKD (chronic kidney disease), stage IV ([place])   . Chronic systolic (congestive) heart failure ([place])   . CAD (coronary artery disease)   . Bradycardia   . HYPERTENSION, MILD 08/26/2010  . BRADYCARDIA 08/26/2010  . EDEMA 08/26/2010  . Coronary atherosclerosis 08/24/2010    Orientation RESPIRATION BLADDER Height & Weight     Self, Time, Situation, Place  Normal Incontinent, External catheter Weight: 58 lb (26.3 kg)(Pt laying on side refuse to lay on back for accurate lbs.) Height:  5\' 10"  (177.8 cm)  BEHAVIORAL SYMPTOMS/MOOD NEUROLOGICAL BOWEL NUTRITION STATUS  (None) (None) Continent Diet(Renal)  AMBULATORY STATUS COMMUNICATION OF NEEDS Skin   Limited Assist Verbally PU Stage and Appropriate Care, Other (Comment), Bruising(Deep tissue injuries on both heels (Foam).)   PU Stage 2 Dressing: No  Dressing(Posterior medial sacrum and left anterior second toe.) PU Stage 3 Dressing: (Mid sacrum: Foam every 3 days.)                 Personal Care Assistance Level of Assistance  Bathing, Feeding, Dressing Bathing Assistance: Limited assistance Feeding assistance: Limited assistance Dressing Assistance: Limited assistance     Functional Limitations Info  Sight, Hearing, Speech Sight Info: Adequate Hearing Info: Adequate Speech Info: Adequate    SPECIAL CARE FACTORS FREQUENCY  PT (By licensed PT), Blood pressure, OT (By licensed OT)     PT Frequency: 5 x week OT Frequency: 5 x week            Contractures Contractures Info: Not present    Additional Factors Info  Code Status, Allergies Code Status Info: DNR Allergies Info: Doxycycline, Penicillins, Sulfonamide Derivatives.           Current Medications (02/17/2019):  This is the current hospital active medication list Current Facility-Administered Medications  Medication Dose Route Frequency Provider Last Rate Last Dose  . 0.9 %  sodium chloride infusion   Intravenous Continuous Domenic Polite, MD 75 mL/hr at 02/17/19 1121    . acetaminophen (TYLENOL) tablet 500 mg  500 mg Oral Q6H PRN Domenic Polite, MD      . acetaminophen (TYLENOL) tablet 500 mg  500 mg Oral QHS Domenic Polite, MD   500 mg at 02/16/19 2348  . allopurinol (ZYLOPRIM) tablet 100 mg  100 mg Oral Daily Domenic Polite, MD   100 mg at 02/17/19 0942  . aspirin chewable tablet 81  mg  81 mg Oral Daily Domenic Polite, MD   81 mg at 02/17/19 0941  . carvedilol (COREG) tablet 12.5 mg  12.5 mg Oral BID WC Domenic Polite, MD   12.5 mg at 02/17/19 0555  . escitalopram (LEXAPRO) tablet 10 mg  10 mg Oral Daily Domenic Polite, MD   10 mg at 02/17/19 0942  . fentaNYL (DURAGESIC) 25 MCG/HR 1 patch  1 patch Transdermal Q72H Domenic Polite, MD      . heparin injection 5,000 Units  5,000 Units Subcutaneous Q8H Domenic Polite, MD   5,000 Units at 02/17/19 0554   . linaclotide (LINZESS) capsule 145 mcg  145 mcg Oral QAC breakfast Domenic Polite, MD   145 mcg at 02/17/19 0555  . ondansetron (ZOFRAN) tablet 4 mg  4 mg Oral Q6H PRN Domenic Polite, MD       Or  . ondansetron [facility]) injection 4 mg  4 mg Intravenous Q6H PRN Domenic Polite, MD      . oxyCODONE (Oxy IR/ROXICODONE) immediate release tablet 5 mg  5 mg Oral Q4H PRN Domenic Polite, MD      . pantoprazole (PROTONIX) EC tablet 40 mg  40 mg Oral Daily Domenic Polite, MD   40 mg at 02/17/19 0941  . polyethylene glycol (MIRALAX / GLYCOLAX) packet 17 g  17 g Oral BID Domenic Polite, MD   17 g at 02/17/19 0943  . senna (SENOKOT) tablet 17.2 mg  2 tablet Oral BID Domenic Polite, MD   17.2 mg at 02/17/19 0941  . traZODone (DESYREL) tablet 50 mg  50 mg Oral QHS PRN Domenic Polite, MD   50 mg at 02/17/19 4765     Discharge Medications: Please see discharge summary for a list of discharge medications.  Relevant Imaging Results:  Relevant Lab Results:   Additional Information SS#: [id number]. Daughter requesting someone check on her about every 30 minutes during third shift since she is typically awake at night and this is when most of her accidents occur.  Candie Chroman, LCSW

## 2019-02-18 LAB — CBC
HCT: 30.4 % — ABNORMAL LOW (ref 36.0–46.0)
Hemoglobin: 9.1 g/dL — ABNORMAL LOW (ref 12.0–15.0)
MCH: 27.9 pg (ref 26.0–34.0)
MCHC: 29.9 g/dL — ABNORMAL LOW (ref 30.0–36.0)
MCV: 93.3 fL (ref 80.0–100.0)
PLATELETS: 151 10*3/uL (ref 150–400)
RBC: 3.26 MIL/uL — ABNORMAL LOW (ref 3.87–5.11)
RDW: 15.9 % — ABNORMAL HIGH (ref 11.5–15.5)
WBC: 3.4 10*3/uL — ABNORMAL LOW (ref 4.0–10.5)
nRBC: 0 % (ref 0.0–0.2)

## 2019-02-18 LAB — BASIC METABOLIC PANEL
Anion gap: 7 (ref 5–15)
BUN: 53 mg/dL — ABNORMAL HIGH (ref 8–23)
CO2: 20 mmol/L — ABNORMAL LOW (ref 22–32)
Calcium: 9.1 mg/dL (ref 8.9–10.3)
Chloride: 116 mmol/L — ABNORMAL HIGH (ref 98–111)
Creatinine, Ser: 2.72 mg/dL — ABNORMAL HIGH (ref 0.44–1.00)
GFR calc non Af Amer: 14 mL/min — ABNORMAL LOW (ref >60)
GFR, EST AFRICAN AMERICAN: 17 mL/min — AB (ref >60)
Glucose, Bld: 104 mg/dL — ABNORMAL HIGH (ref 70–99)
Potassium: 3.7 mmol/L (ref 3.5–5.1)
Sodium: 143 mmol/L (ref 135–145)

## 2019-02-18 MED ORDER — OXYCODONE HCL 5 MG PO TABS: 5.0000 mg | ORAL_TABLET | Freq: Four times a day (QID) | ORAL | 0 refills | Status: AC | PRN

## 2019-02-18 MED ORDER — FENTANYL 25 MCG/HR TD PT72: 1.0000 | MEDICATED_PATCH | TRANSDERMAL | 0 refills | Status: AC

## 2019-02-18 MED ORDER — ISOSORBIDE MONONITRATE ER 30 MG PO TB24: 30.0000 mg | ORAL_TABLET | Freq: Every day | ORAL | Status: AC

## 2019-02-18 MED ORDER — FUROSEMIDE 40 MG PO TABS: 40.0000 mg | ORAL_TABLET | Freq: Every day | ORAL | Status: AC

## 2019-02-18 NOTE — Clinical Social Work Note (Signed)
CSW facilitated patient discharge including contacting patient family (Daughter Vaughan Basta) and facility to confirm patient discharge plans. Clinical information faxed to facility and family agreeable with plan. CSW arranged ambulance transport via [place] to [facility]. RN to call report prior to discharge [phone]).  CSW will sign off for now as social work intervention is no longer needed. Please consult [place] again if new needs arise.  [place]ton Scrape, [place]

## 2019-02-18 NOTE — Consult Note (Deleted)
[place] Nurse wound consult note Reason for Consult: wound on left foot Wound type: dry and scabbed Pressure Injury POA: No Measurement: 0.5 in diameter  Wound bed: scabbed Drainage (amount, consistency, odor) none, no odor Periwound: intact Dressing procedure/placement/frequency: keep wound clean and dry.  No dressing.   Weak distal pulses, no treatment at this time.  Will not follow patient. Patient is followed by Vascular Surgery, pt is to continue follow up with them.  [name]. [place], [place], [place]

## 2019-02-18 NOTE — Progress Notes (Signed)
RN attempted report to SNF.

## 2019-02-18 NOTE — Discharge Summary (Signed)
Physician Discharge Summary  Laura Houston DJT:[id number] DOB: Sep 25, 1923 DOA: 02/16/2019  PCP: Patient, No Pcp Per  Admit date: 02/16/2019 Discharge date: 02/18/2019  Time spent: 35 minutes  Recommendations for Outpatient Follow-up:  PCP in 1 week, Hospice at SNF  Discharge Diagnoses:  Principal Problem:   AKI (acute kidney injury) ([place])   CKD stage 4   Cognitive dysfunction   Right renal mass suspicious for Renal cell carcinoma    HYPERTENSION, MILD   Status post placement of cardiac pacemaker   GERD (gastroesophageal reflux disease)   Chronic systolic (congestive) heart failure (HCC)   CAD (coronary artery disease)   Protein-calorie malnutrition, severe   Discharge Condition: stable  Diet recommendation: low sodium   Filed Weights   02/16/19 0940 02/17/19 0340  Weight: 53.2 kg 26.3 kg    History of present illness:  Laura Stevensis a 85 y.o.femalewith medical history significant ofCAD, chronic diastolic CHF, chronic kidney disease stage IV, history of pacemaker, GERD, gout, remote history of colon cancer,mild cognitive dysfunction and sundowning per daughter currently residing at [facility] for the last 4 months since her left hip fracture and repair. -She'smostly wheelchair-bound at baseline,except takes a few steps when physical therapy works with her. -she reportedly fell multiple times this past week,and was brought to the ER following another fall,she was found on the floor beside her bed last night,she was noted to have bruising in her left cheek and mild swelling of her left forehead. Found to have worsening kidney function with creatinine of 4.5 and BUN of 90, hemoglobin of 8.3. -CT head with chronic findings only  Hospital Course:   AKI (acute kidney injury) ([place]) -In the background of chronic kidney disease stage IV with baseline creatinine around 2.5, on admission creatinine was 4.5 -Prerenal, improving with hydration, down to 2.7 today and  close to baseline -Lasix held on admission, resume in 2 days -Renal ultrasound without hydronephrosis, right renal mass concerning for RCC noted -discharge back to SNF today with hospice  Right renal mass -Suspicious for renal cell carcinoma -Discussed about this with daughter who reports that family has known about mass in her kidneys for 6 to 12 months from workup in [place], due to advanced age debility and chronic illnesses no further work-up was pursued -Discussed with daughter again, agreed that no further work-up is indicated given advanced age and frailty  Normocytic anemia -Hemoglobin at baseline,likely has anemia from chronic kidney disease -Stable,  Chronic pain -Patient is on a regimen of high-dose fentanyl patch at 75 mcg and oxycodone as needed -I have decreased fentanyl patch dose [id number]  Chronic diastolic CHF -Currently dry with AKI,monitor with hydration, continue Coreg -resume lasix in 2 days  Severe protein calorie malnutrition -Supplements as tolerated  CAD -stable  Frequent falls -Xrays unremarable -CT head w/ chronic changes -PT eval,decreased fentanyl patch dose  Goals:This is a chronically ill debilitated mostly bedbound SNF resident with ongoing failure to thrive, frequent falls, now worsening AKI, suspected renal cell carcinoma -She is not a dialysis candidate  -Discussed overall poor prognosis with daughter, patient has had hospice services in the past, daughter agreeable to resume hospice at [facility], she is DNR   Discharge Exam: Vitals:   02/17/19 1947 02/18/19 0813  BP: 122/67 (!) 126/59  Pulse: 62 60  Resp: 18 18  Temp: 98.2 F (36.8 C) 98.3 F (36.8 C)  SpO2: 100% 100%    General: AAOx2 Cardiovascular: S1S2/RRR Respiratory: CTAB  Discharge Instructions   Discharge Instructions  Diet - low sodium heart healthy   Complete by:  As directed    Increase activity slowly   Complete by:  As directed      Allergies as  of 02/18/2019      Reactions   Doxycycline Other (See Comments)   On MAR   Penicillins Other (See Comments)   Has patient had a PCN reaction causing immediate rash, facial/tongue/throat swelling, SOB or lightheadedness with hypotension: Unknown Has patient had a PCN reaction causing severe rash involving mucus membranes or skin necrosis: Unknown Has patient had a PCN reaction that required hospitalization: Unknown Has patient had a PCN reaction occurring within the last 10 years: Unknown If all of the above answers are "NO", then may proceed with Cephalosporin use.   Sulfonamide Derivatives       Medication List    STOP taking these medications   fentaNYL 75 MCG/HR Commonly known as:  DURAGESIC Replaced by:  fentaNYL 25 MCG/HR     TAKE these medications   acetaminophen 500 MG tablet Commonly known as:  TYLENOL Take 500 mg by mouth at bedtime.   acetaminophen 500 MG tablet Commonly known as:  TYLENOL Take 500 mg by mouth every 6 (six) hours as needed for mild pain, moderate pain or headache.   allopurinol 100 MG tablet Commonly known as:  ZYLOPRIM Take 100 mg by mouth daily.   amLODipine 5 MG tablet Commonly known as:  NORVASC Take 5 mg by mouth daily.   aspirin 81 MG chewable tablet Chew 81 mg by mouth daily.   carvedilol 12.5 MG tablet Commonly known as:  COREG Take 12.5 mg by mouth 2 (two) times daily with a meal.   cholecalciferol 1000 units tablet Commonly known as:  VITAMIN D Take 1,000 Units by mouth daily.   DECUBI-VITE PO Take 1 tablet by mouth daily.   escitalopram 10 MG tablet Commonly known as:  LEXAPRO Take 10 mg by mouth daily.   fentaNYL 25 MCG/HR Commonly known as:  [place] 1 patch onto the skin every 3 (three) days. Start taking on:  February 20, 2019 Replaces:  fentaNYL 75 MCG/HR   ferrous sulfate 325 (65 FE) MG EC tablet Take 325 mg by mouth 2 (two) times daily.   furosemide 40 MG tablet Commonly known as:  LASIX Take 1 tablet  (40 mg total) by mouth at bedtime. Start taking on:  February 20, 2019 What changed:  These instructions start on February 20, 2019. If you are unsure what to do until then, ask your doctor or other care provider.   hyoscyamine 0.125 MG tablet Commonly known as:  LEVSIN, ANASPAZ Take 0.125 mg by mouth every 4 (four) hours as needed (excess secretions).   isosorbide mononitrate 30 MG 24 hr tablet Commonly known as:  IMDUR Take 1 tablet (30 mg total) by mouth daily. What changed:    medication strength  how much to take   lactulose 10 GM/15ML solution Commonly known as:  CHRONULAC Take 30 g by mouth 2 (two) times daily as needed for mild constipation.   Linzess 145 MCG Caps capsule Generic drug:  linaclotide Take 145 mcg by mouth daily before breakfast.   Melatonin 3 MG Tabs Take 6 mg by mouth at bedtime.   mineral oil enema Place 1 enema rectally as needed for severe constipation.   NON FORMULARY Take 1 Container by mouth 3 (three) times daily with meals. MAGIC CUP   omeprazole 20 MG capsule Commonly known as:  PRILOSEC Take  20 mg by mouth daily.   oxyCODONE 5 MG immediate release tablet Commonly known as:  Oxy IR/ROXICODONE Take 1 tablet (5 mg total) by mouth every 6 (six) hours as needed for moderate pain or severe pain. What changed:  when to take this   polyethylene glycol packet Commonly known as:  MIRALAX / GLYCOLAX Take 17 g by mouth 2 (two) times daily.   senna 8.6 MG tablet Commonly known as:  SENOKOT Take 2 tablets by mouth 2 (two) times daily.   traZODone 100 MG tablet Commonly known as:  DESYREL Take 100 mg by mouth at bedtime.   vitamin C 500 MG tablet Commonly known as:  ASCORBIC ACID Take 500 mg by mouth daily.      Allergies  Allergen Reactions  . Doxycycline Other (See Comments)    On MAR  . Penicillins Other (See Comments)    Has patient had a PCN reaction causing immediate rash, facial/tongue/throat swelling, SOB or lightheadedness with  hypotension: Unknown Has patient had a PCN reaction causing severe rash involving mucus membranes or skin necrosis: Unknown Has patient had a PCN reaction that required hospitalization: Unknown Has patient had a PCN reaction occurring within the last 10 years: Unknown If all of the above answers are "NO", then may proceed with Cephalosporin use.   . Sulfonamide Derivatives     Contact information for follow-up providers    PCP. Schedule an appointment as soon as possible for a visit in 1 week(s).            Contact information for after-discharge care    [facility] .   Service:  Skilled Nursing Contact information: [place] [phone]                   The results of significant diagnostics from this hospitalization (including imaging, microbiology, ancillary and laboratory) are listed below for reference.    Significant Diagnostic Studies: Dg Thoracic Spine 2 View  Result Date: 02/03/2019 CLINICAL DATA:  85 year old female with fall and back pain. EXAM: THORACIC SPINE 2 VIEWS; LUMBAR SPINE - COMPLETE 4+ VIEW COMPARISON:  Chest radiograph dated 11/02/2018 and 07/11/2010 FINDINGS: Age indeterminate, likely chronic T11 and T12 compression fractures with anterior wedging. Correlation with clinical exam and point tenderness recommended. There is osteopenia with multilevel degenerative changes. No acute fracture or subluxation of the lumbar spine. There is atherosclerotic calcification of the aorta. Surgical clips and sutures noted in the left hemiabdomen. The soft tissues are grossly unremarkable. Partially visualized left hip hemiarthroplasty. IMPRESSION: Age indeterminate T11 and T12 compression fractures, likely chronic. Correlation with clinical exam and point tenderness recommended. Electronically Signed   By: Anner Crete M.D.   On: 02/03/2019 05:05   Dg Lumbar Spine Complete  Result Date:  02/03/2019 CLINICAL DATA:  85 year old female with fall and back pain. EXAM: THORACIC SPINE 2 VIEWS; LUMBAR SPINE - COMPLETE 4+ VIEW COMPARISON:  Chest radiograph dated 11/02/2018 and 07/11/2010 FINDINGS: Age indeterminate, likely chronic T11 and T12 compression fractures with anterior wedging. Correlation with clinical exam and point tenderness recommended. There is osteopenia with multilevel degenerative changes. No acute fracture or subluxation of the lumbar spine. There is atherosclerotic calcification of the aorta. Surgical clips and sutures noted in the left hemiabdomen. The soft tissues are grossly unremarkable. Partially visualized left hip hemiarthroplasty. IMPRESSION: Age indeterminate T11 and T12 compression fractures, likely chronic. Correlation with clinical exam and point tenderness recommended. Electronically Signed  By: Anner Crete M.D.   On: 02/03/2019 05:05   Ct Head Wo Contrast  Result Date: 02/16/2019 CLINICAL DATA:  Multiple recent falls.  LEFT forehead swelling. EXAM: CT HEAD WITHOUT CONTRAST TECHNIQUE: Contiguous axial images were obtained from the base of the skull through the vertex without intravenous contrast. COMPARISON:  CT HEAD February 03, 2019 FINDINGS: BRAIN: No intraparenchymal hemorrhage, mass effect nor midline shift. No parenchymal brain volume loss for age. No hydrocephalus. Patchy supratentorial white matter hypodensities. 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 No acute large vascular territory infarcts. No abnormal extra-axial fluid collections. Basal cisterns are patent. VASCULAR: Moderate calcific atherosclerosis of the carotid siphons. Dolichoectatic intracranial vessels seen with chronic hypertension. SKULL: No skull fracture. 1.6 x 2 cm LEFT frontal complex nodule at site of prior hematoma. SINUSES/ORBITS: Trace paranasal sinus mucosal thickening. Mastoid air cells are well aerated.The included ocular globes and orbital contents  are non-suspicious. Status post bilateral ocular lens implants. OTHER: LEFT periorbital soft tissue swelling without subcutaneous gas or radiopaque foreign bodies. IMPRESSION: 1. No acute intracranial process. 2. 2 cm LEFT frontal complex nodule, likely residual hematoma given site of recent trauma. 3. Otherwise stable examination including moderate chronic small vessel ischemic changes. Electronically Signed   By: Elon Alas M.D.   On: 02/16/2019 05:56   Ct Head Wo Contrast  Result Date: 02/03/2019 CLINICAL DATA:  85 y/o F; unwitnessed fall, found on floor. Hematoma to the left forehead. EXAM: CT HEAD WITHOUT CONTRAST CT CERVICAL SPINE WITHOUT CONTRAST TECHNIQUE: Multidetector CT imaging of the head and cervical spine was performed following the standard protocol without intravenous contrast. Multiplanar CT image reconstructions of the cervical spine were also generated. COMPARISON:  06/01/2010 MRI head. 06/02/2010 MRI cervical spine. FINDINGS: CT HEAD FINDINGS Brain: Motion degraded study. No evidence of acute infarction, hemorrhage, hydrocephalus, extra-axial collection or mass lesion/mass effect. Nonspecific white matter hypodensities are compatible with chronic microvascular ischemic changes and there is volume loss of the brain, stable from prior MRI given differences in technique. Small chronic infarct in the right thalamus. Vascular: Calcific atherosclerosis of carotid siphons and vertebral arteries. No hyperdense vessel identified. Skull: Left frontal scalp hematoma. No calvarial fracture. Sinuses/Orbits: No acute finding. Other: None. CT CERVICAL SPINE FINDINGS Alignment: Straightening of cervical lordosis. C7-T1 grade 1 anterolisthesis. Skull base and vertebrae: Motion degraded study. No acute fracture. No primary bone lesion or focal pathologic process. Soft tissues and spinal canal: No prevertebral fluid or swelling. No visible canal hematoma. Disc levels: Cervical spondylosis with moderate  discogenic degenerative changes at C4-C7, right-sided ligamentum flavum ossification at C3-4, and upper cervical predominant facet arthropathy. Uncovertebral and facet hypertrophy results in bilateral C2-3, left-greater-than-right C3-4, bilateral C4-5, bilateral C5-6, bilateral C6-7 high-grade foraminal stenosis. C3-4 right central disc protrusion and ossification of the right-sided ligamentum flavum results in moderate spinal canal stenosis and probable cord impingement. Upper chest: Negative. Other: Calcific atherosclerosis of the carotid bifurcations. IMPRESSION: CT head: 1. Motion degraded study. 2. Left frontal scalp hematoma. No calvarial fracture. 3. No acute intracranial abnormality identified. 4. Stable chronic microvascular ischemic changes and volume loss of the brain. CT cervical spine: 1. Motion degraded study. 2. No acute fracture or dislocation identified. 3. Cervical spondylosis greatest the C3-4 level there is moderate canal stenosis and probable cord impingement. Electronically Signed   By: Kristine Garbe M.D.   On: 02/03/2019 05:26   Ct Cervical Spine Wo Contrast  Result Date: 02/03/2019 CLINICAL DATA:  85 y/o F; unwitnessed fall,  found on floor. Hematoma to the left forehead. EXAM: CT HEAD WITHOUT CONTRAST CT CERVICAL SPINE WITHOUT CONTRAST TECHNIQUE: Multidetector CT imaging of the head and cervical spine was performed following the standard protocol without intravenous contrast. Multiplanar CT image reconstructions of the cervical spine were also generated. COMPARISON:  06/01/2010 MRI head. 06/02/2010 MRI cervical spine. FINDINGS: CT HEAD FINDINGS Brain: Motion degraded study. No evidence of acute infarction, hemorrhage, hydrocephalus, extra-axial collection or mass lesion/mass effect. Nonspecific white matter hypodensities are compatible with chronic microvascular ischemic changes and there is volume loss of the brain, stable from prior MRI given differences in technique. Small  chronic infarct in the right thalamus. Vascular: Calcific atherosclerosis of carotid siphons and vertebral arteries. No hyperdense vessel identified. Skull: Left frontal scalp hematoma. No calvarial fracture. Sinuses/Orbits: No acute finding. Other: None. CT CERVICAL SPINE FINDINGS Alignment: Straightening of cervical lordosis. C7-T1 grade 1 anterolisthesis. Skull base and vertebrae: Motion degraded study. No acute fracture. No primary bone lesion or focal pathologic process. Soft tissues and spinal canal: No prevertebral fluid or swelling. No visible canal hematoma. Disc levels: Cervical spondylosis with moderate discogenic degenerative changes at C4-C7, right-sided ligamentum flavum ossification at C3-4, and upper cervical predominant facet arthropathy. Uncovertebral and facet hypertrophy results in bilateral C2-3, left-greater-than-right C3-4, bilateral C4-5, bilateral C5-6, bilateral C6-7 high-grade foraminal stenosis. C3-4 right central disc protrusion and ossification of the right-sided ligamentum flavum results in moderate spinal canal stenosis and probable cord impingement. Upper chest: Negative. Other: Calcific atherosclerosis of the carotid bifurcations. IMPRESSION: CT head: 1. Motion degraded study. 2. Left frontal scalp hematoma. No calvarial fracture. 3. No acute intracranial abnormality identified. 4. Stable chronic microvascular ischemic changes and volume loss of the brain. CT cervical spine: 1. Motion degraded study. 2. No acute fracture or dislocation identified. 3. Cervical spondylosis greatest the C3-4 level there is moderate canal stenosis and probable cord impingement. Electronically Signed   By: Kristine Garbe M.D.   On: 02/03/2019 05:26   US Renal  Result Date: 02/16/2019 CLINICAL DATA:  Acute on chronic kidney injury EXAM: RENAL / URINARY TRACT ULTRASOUND COMPLETE COMPARISON:  11/02/2018 renal sonogram FINDINGS: Right Kidney: Renal measurements: 11.6 x 4.2 x 5.1 cm = volume: 127  mL. Solid 5.4 x 4.4 x 4.1 cm lower right renal mass, previously 3.9 x 3.9 x 3.8 cm. Echogenic right renal parenchyma. No right hydronephrosis. Left Kidney: Left kidney could not be visualized on this scan presumably due to overlying rib and bowel gas shadowing and patient body habitus. Bladder: Relatively collapsed and grossly normal bladder. IMPRESSION: 1. Solid 5.4 cm lower right renal mass, apparently increased in size from 11/02/2018 sonogram, for which renal cell carcinoma is the diagnosis of exclusion. Dedicated renal mass protocol MRI or CT abdomen without and with IV contrast is indicated for further characterization, and may be performed as clinically warranted. 2. No right hydronephrosis. 3. Echogenic right renal parenchyma indicative of nonspecific renal parenchymal disease of uncertain chronicity. 4. Nonvisualization of the left kidney due to scan limitations as detailed. The left kidney could also be evaluated on a renal mass protocol CT or MR as recommended above. 5. Relatively collapsed and grossly normal bladder. Electronically Signed   By: Ilona Sorrel M.D.   On: 02/16/2019 15:23    Microbiology: Recent Results (from the past 240 hour(s))  Urine culture     Status: Abnormal   Collection Time: 02/16/19  7:00 AM  Result Value Ref Range Status   Specimen Description URINE, CLEAN CATCH  Final  Special Requests   Final    NONE Performed at [facility], [place] [address] [place], [place] [address]    Culture MULTIPLE SPECIES PRESENT, SUGGEST RECOLLECTION (A)  Final   Report Status 02/17/2019 FINAL  Final  MRSA PCR Screening     Status: None   Collection Time: 02/16/19  9:28 AM  Result Value Ref Range Status   MRSA by PCR NEGATIVE NEGATIVE Final    Comment:        The GeneXpert MRSA Assay (FDA approved for NASAL specimens only), is one component of a comprehensive MRSA colonization surveillance program. It is not intended to diagnose MRSA infection nor to guide or monitor  treatment for MRSA infections. Performed at [facility], [place] [address] [place], [place] [address]      Labs: Basic Metabolic Panel: Recent Labs  Lab 02/16/19 0524 02/16/19 1011 02/17/19 0427 02/18/19 0928  NA 143  --  143 143  K 4.4  --  4.0 3.7  CL 111  --  115* 116*  CO2 21*  --  20* 20*  GLUCOSE 105*  --  96 104*  BUN 90*  --  72* 53*  CREATININE 4.55* 4.20* 3.44* 2.72*  CALCIUM 9.0  --  9.0 9.1   Liver Function Tests: No results for input(s): AST, ALT, ALKPHOS, BILITOT, PROT, ALBUMIN in the last 168 hours. No results for input(s): LIPASE, AMYLASE in the last 168 hours. No results for input(s): AMMONIA in the last 168 hours. CBC: Recent Labs  Lab 02/16/19 0524 02/16/19 1011 02/17/19 0427 02/18/19 0928  WBC 4.3 4.5 3.5* 3.4*  NEUTROABS 3.3  --   --   --   HGB 8.3* 8.3* 8.6* 9.1*  HCT 27.2* 27.0* 28.6* 30.4*  MCV 95.8 93.1 93.2 93.3  PLT 137* 139* 134* 151   Cardiac Enzymes: Recent Labs  Lab 02/16/19 0524  CKTOTAL 88   BNP: BNP (last 3 results) Recent Labs    10/29/18 0240  BNP 580.4*    ProBNP (last 3 results) No results for input(s): PROBNP in the last 8760 hours.  CBG: No results for input(s): GLUCAP in the last 168 hours.     Signed:  Domenic Polite MD.  [facility] 02/18/2019, 11:01 AM

## 2019-02-18 NOTE — Consult Note (Signed)
[facility] Nurse wound consult note Reason for Consult: right heal wound Wound type: deep tissue injury Pressure Injury POA: Yes Measurement: 1cm x 3.5 cm Wound bed: dark colored blister Drainage (amount, consistency, odor) none, no odor Periwound: intact Dressing procedure/placement/frequency: no dressing, float heals.    Reason for Consult: sacral wound Wound type: pressure injury Pressure Injury POA: Yes Measurement: 1.5cm x 0.3cm x 0.1cm Wound bed: pink, red, granulating Drainage (amount, consistency, odor) scan amount creamy, white drainage Periwound: intact Dressing procedure/placement/frequency: drainage and foam dressing removed.  Barrier cream recommended.    Pt is currently being discharged.   [name]. [place] [facility], [place], [place]

## 2019-05-12 DEATH — deceased

## 2019-05-16 ENCOUNTER — Telehealth: Payer: Self-pay | Admitting: General Practice

## 2019-10-09 IMAGING — CT CT HEAD W/O CM
4 series · 16 of 47 positions shown, 18 images · non-contrast
Comparison: CT HEAD February 03, 2019

CLINICAL DATA: Multiple recent falls.  LEFT forehead swelling.

EXAM:
CT HEAD WITHOUT CONTRAST
TECHNIQUE: Contiguous axial images were obtained from the base of the skull
through the vertex without intravenous contrast.

[Series 3: head wo · axial · 0.45mm/px · z∈[-138,-18]mm · 7 of 33 slices shown, 9 images]
[im 5/33  brain]
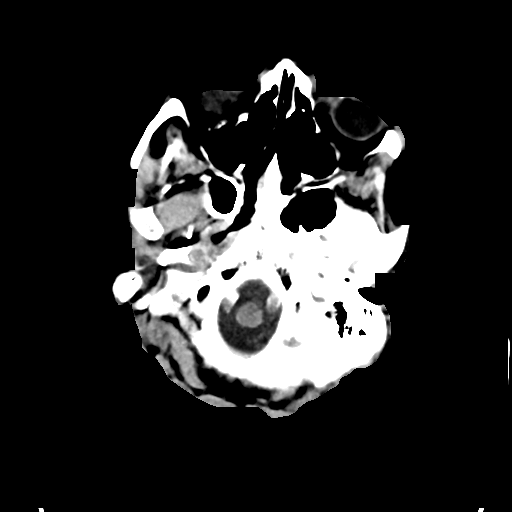
[im 5/33  bone]
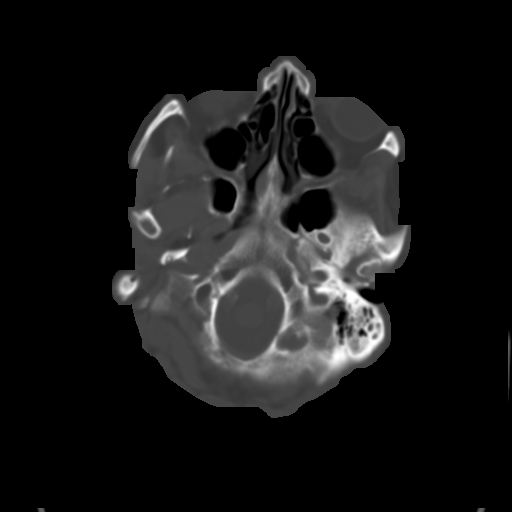
[im 9/33  brain]
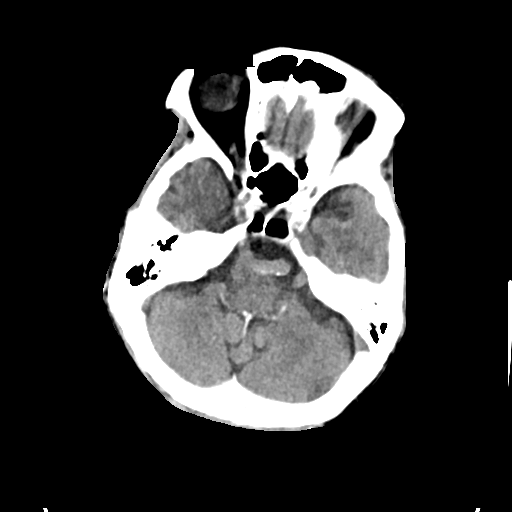
[im 13/33  brain]
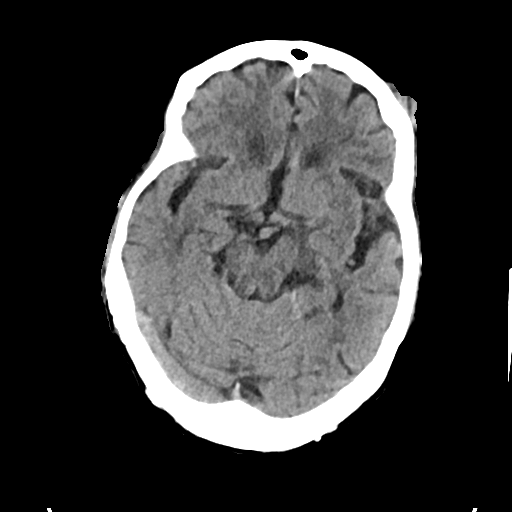
[im 17/33  brain]
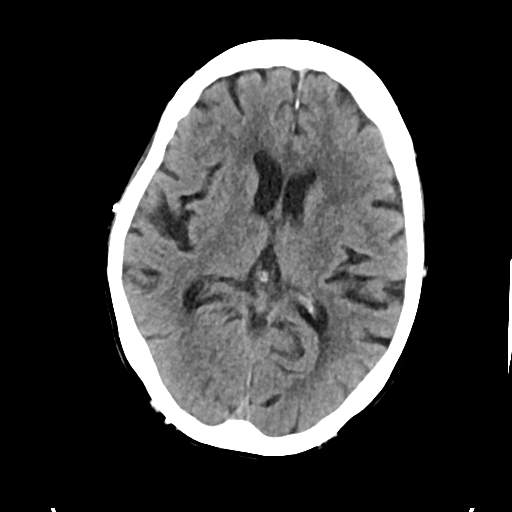
[im 21/33  brain]
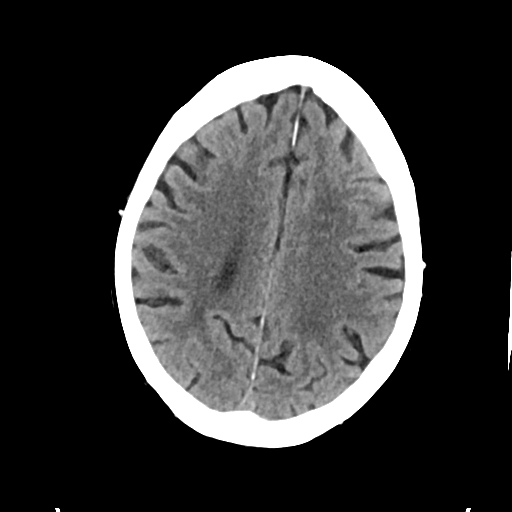
[im 21/33  bone]
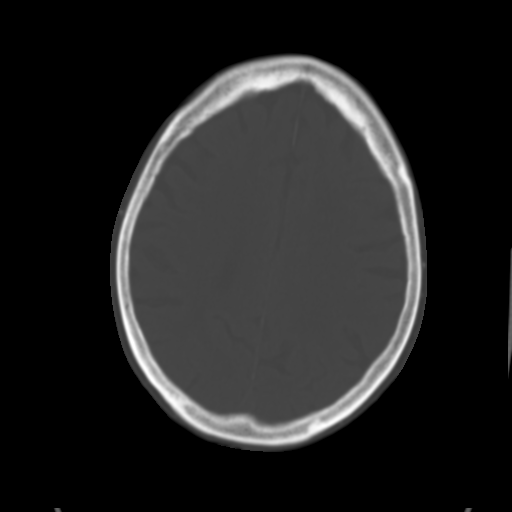
[im 25/33  brain]
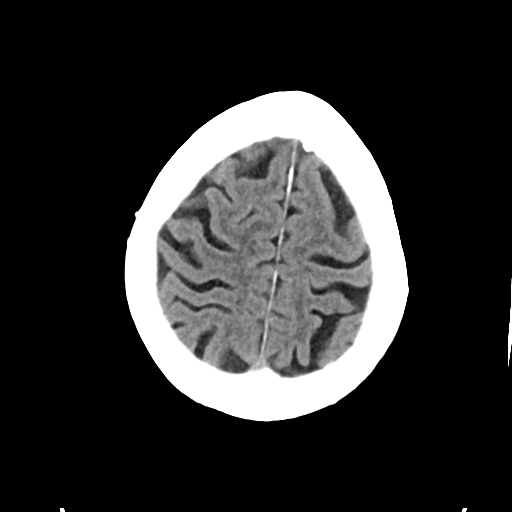
[im 29/33  brain]
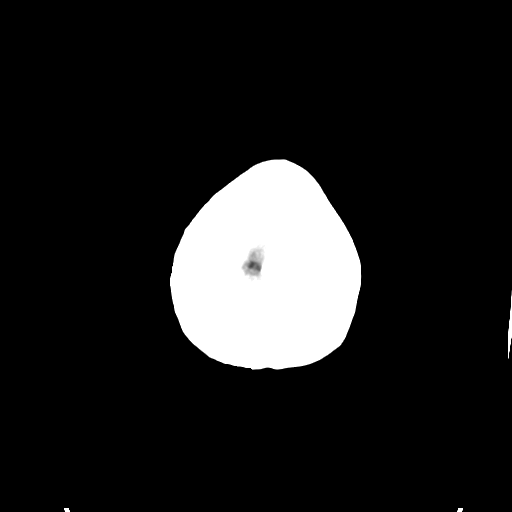

[Series 4: head bone · axial · 0.45mm/px · z∈[-142,-110]mm · 3 of 81 slices shown]
[im 9/81  bone]
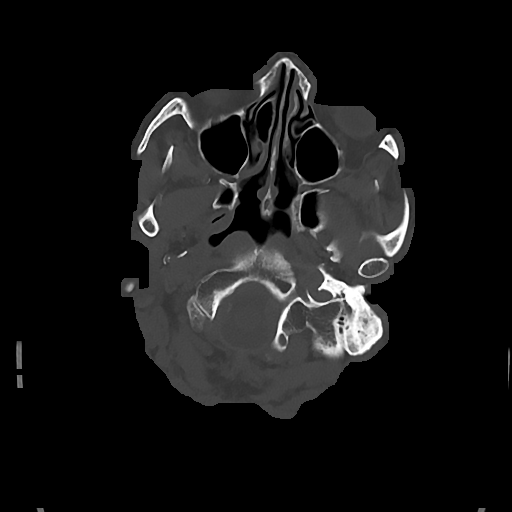
[im 17/81  bone]
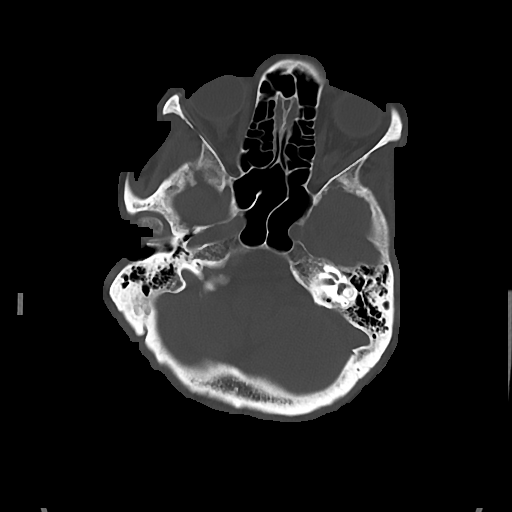
[im 25/81  bone]
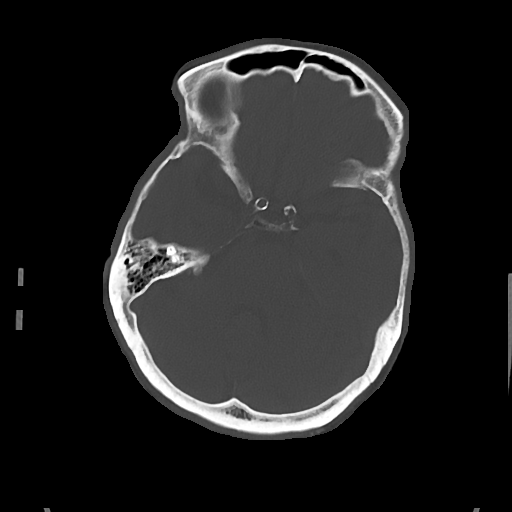

[Series 5: cor soft · coronal · 0.31mm/px · 3 of 67 slices shown]
[im 23/67  brain]
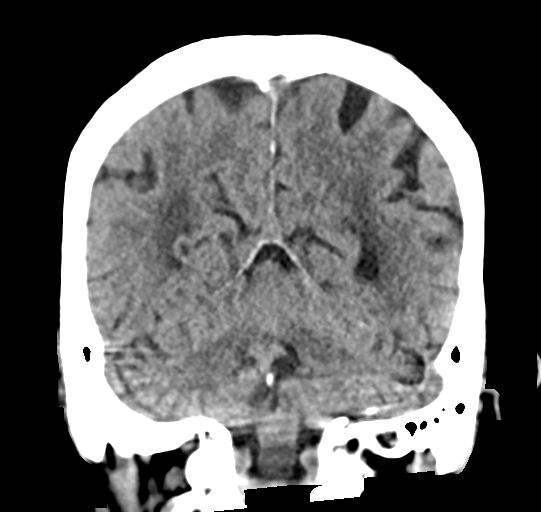
[im 30/67  brain]
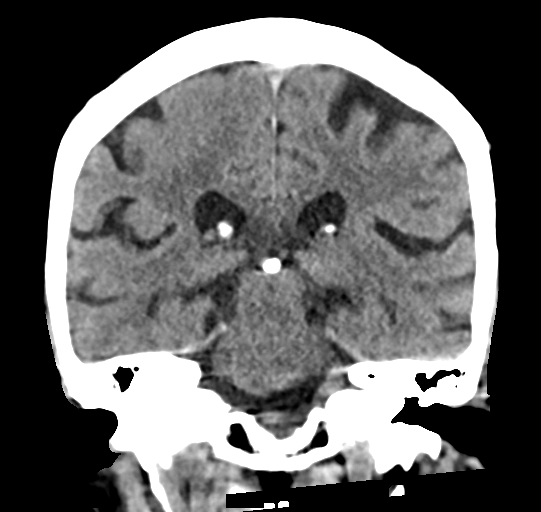
[im 37/67  brain]
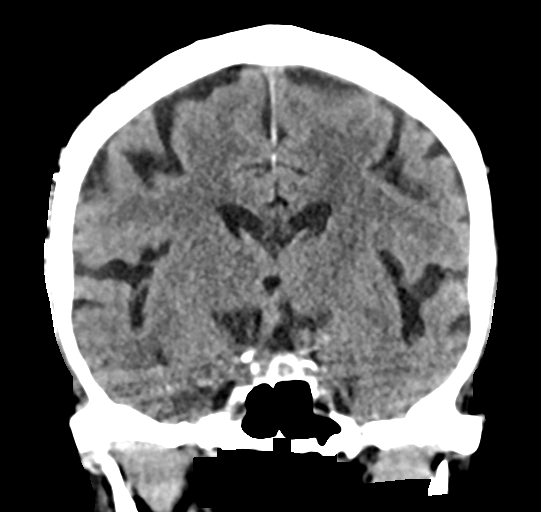

[Series 6: sag soft · sagittal · 0.31mm/px · 3 of 55 slices shown]
[im 19/55  brain]
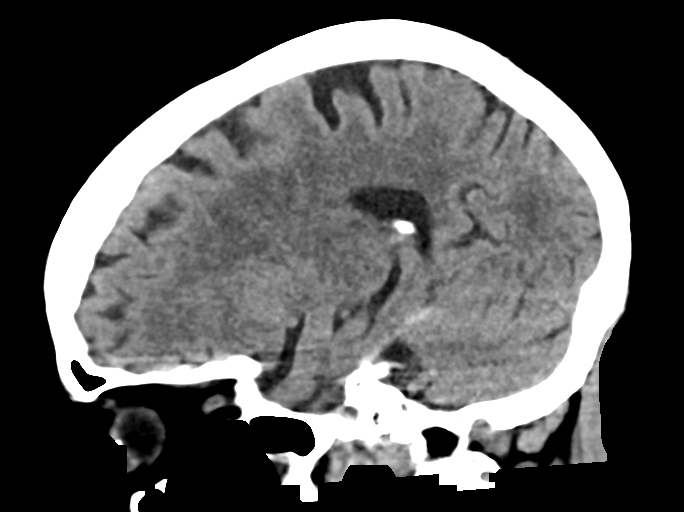
[im 28/55  brain]
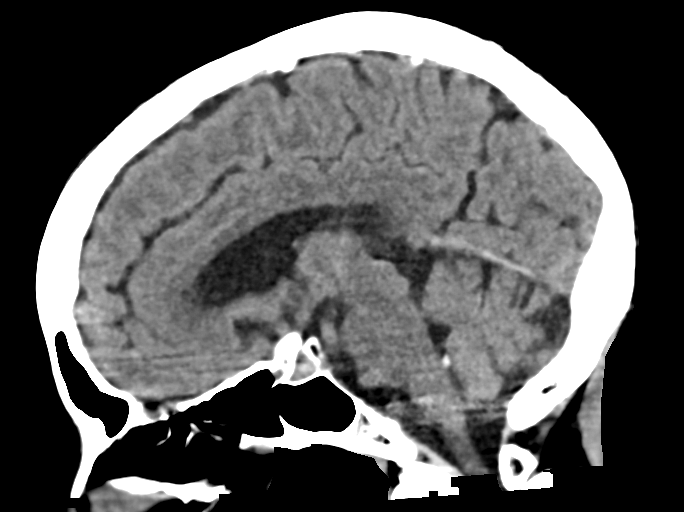
[im 37/55  brain]
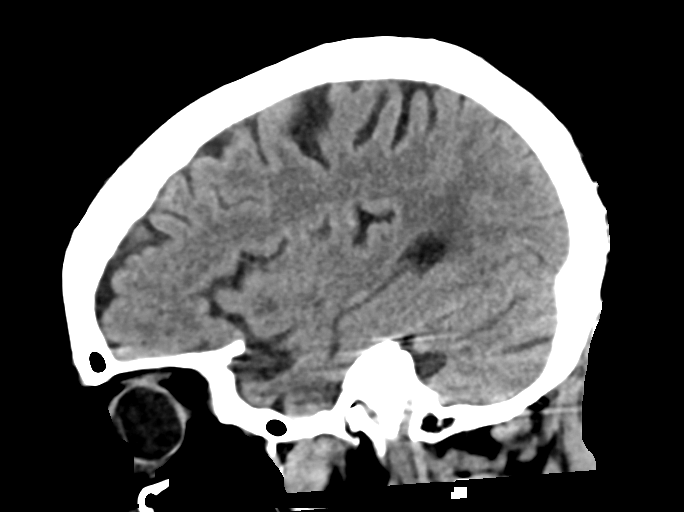

[16 of 47 positions shown; findings below may reference images not displayed]

FINDINGS: BRAIN: No intraparenchymal hemorrhage, mass effect nor midline
shift. No parenchymal brain volume loss for age. No hydrocephalus.
Patchy supratentorial white matter hypodensities. Old RIGHT thalamus
lacunar infarct better demonstrated on prior imaging. No acute large
vascular territory infarcts. No abnormal extra-axial fluid
collections. Basal cisterns are patent.

VASCULAR: Moderate calcific atherosclerosis of the carotid siphons.
Dolichoectatic intracranial vessels seen with chronic hypertension.

SKULL: No skull fracture. 1.6 x 2 cm LEFT frontal complex nodule at
site of prior hematoma.

SINUSES/ORBITS: Trace paranasal sinus mucosal thickening. Mastoid
air cells are well aerated.The included ocular globes and orbital
contents are non-suspicious. Status post bilateral ocular lens
implants.

OTHER: LEFT periorbital soft tissue swelling without subcutaneous
gas or radiopaque foreign bodies.
IMPRESSION: 1. No acute intracranial process.
2. 2 cm LEFT frontal complex nodule, likely residual hematoma given
site of recent trauma.
3. Otherwise stable examination including moderate chronic small
vessel ischemic changes.

## 2019-12-05 IMAGING — US US RENAL
1 series · 13 of 22 positions shown · non-contrast
Comparison: 11/02/2018 renal sonogram

CLINICAL DATA: Acute on chronic kidney injury

EXAM:
RENAL / URINARY TRACT ULTRASOUND COMPLETE

[Series 1: us renal · 0.17mm/px · 13 of 22 slices shown]
[im 1/22]
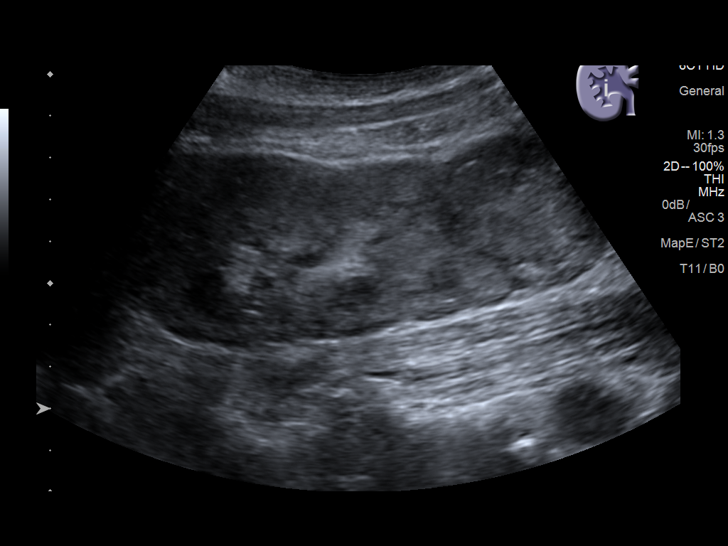
[im 3/22]
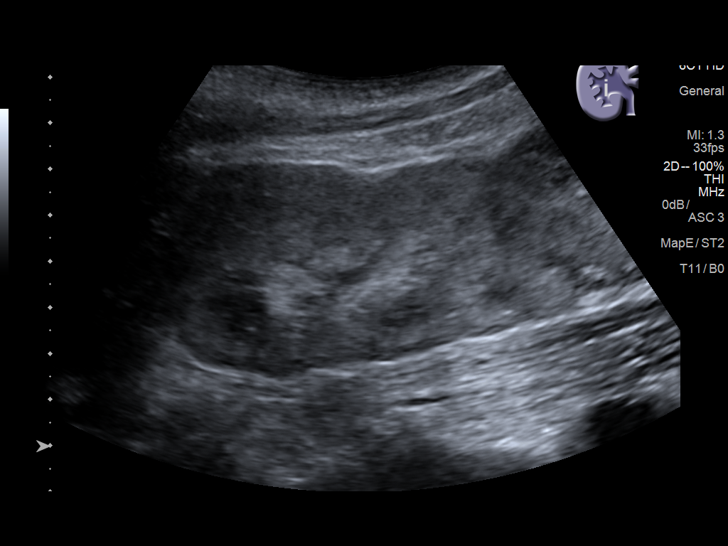
[im 5/22]
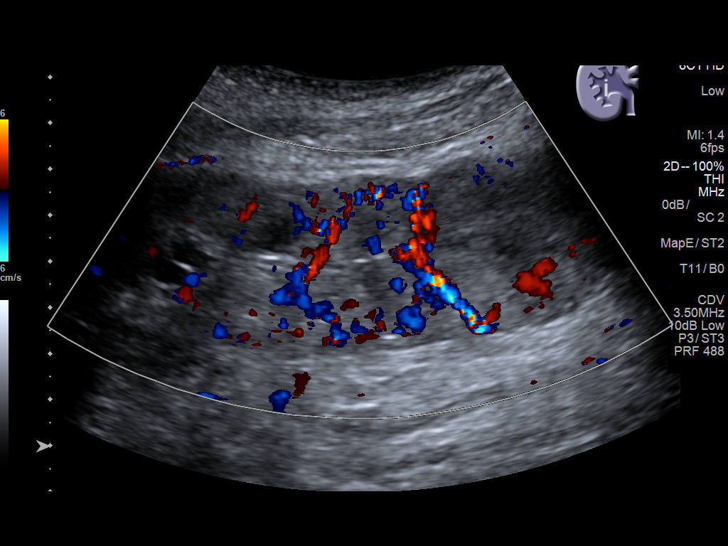
[im 6/22]
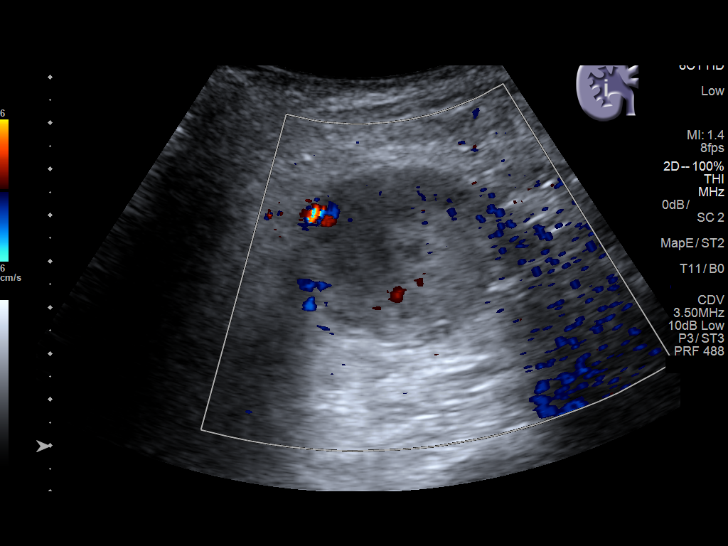
[im 8/22]
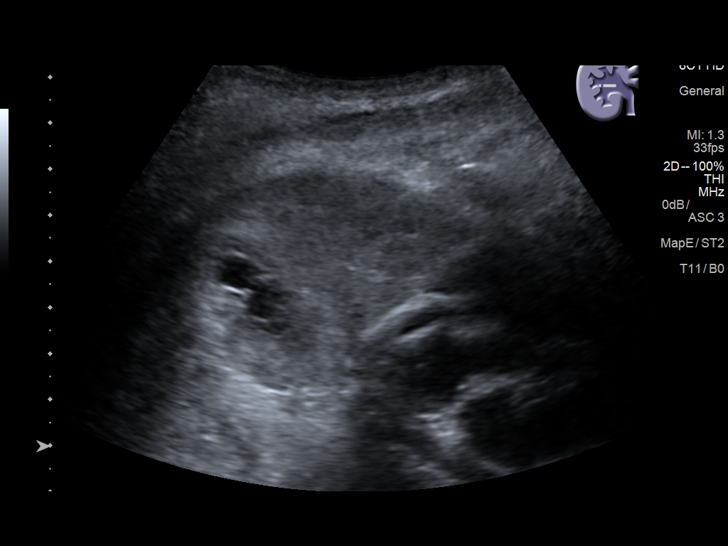
[im 10/22]
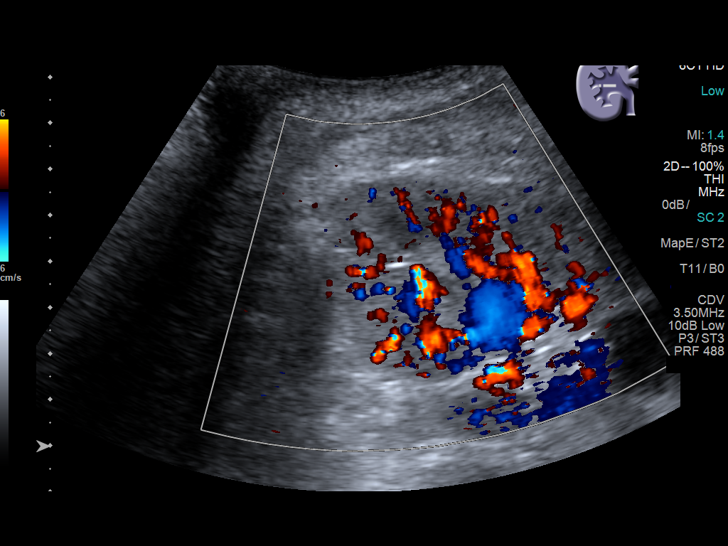
[im 12/22]
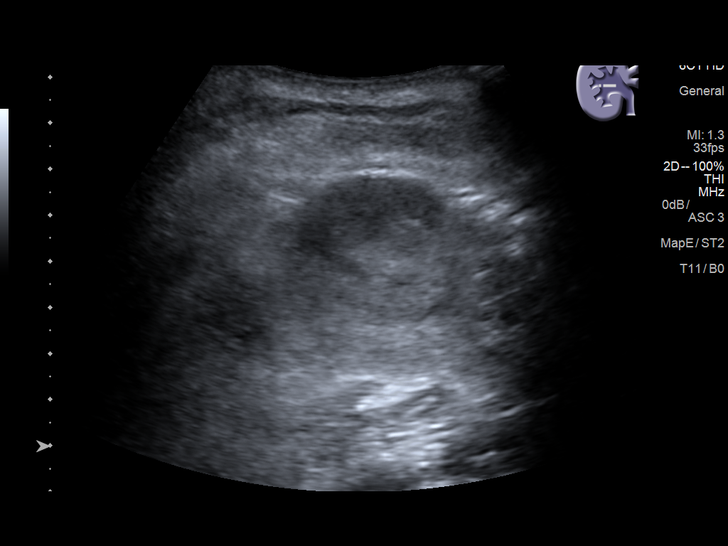
[im 13/22]
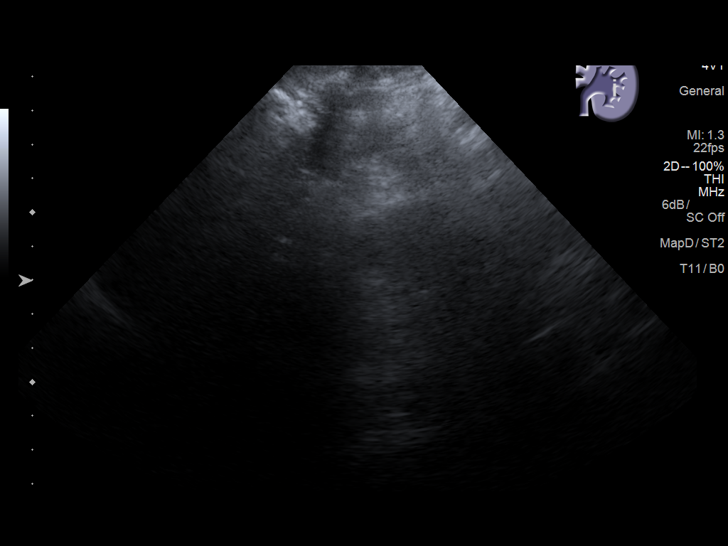
[im 15/22]
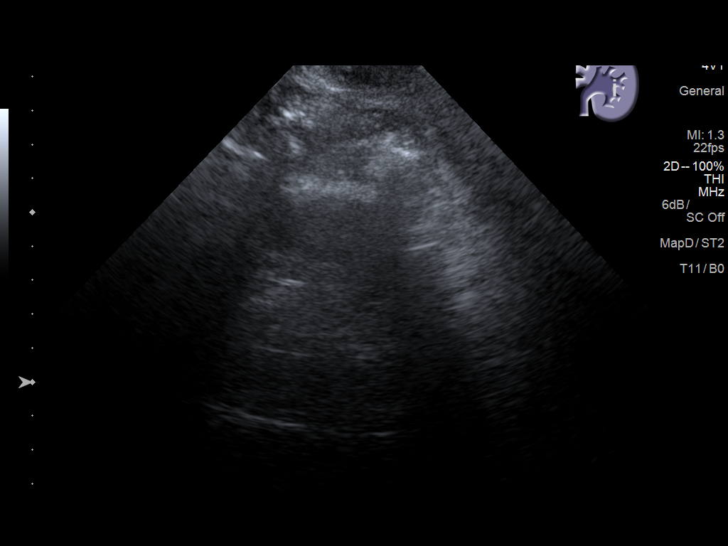
[im 17/22]
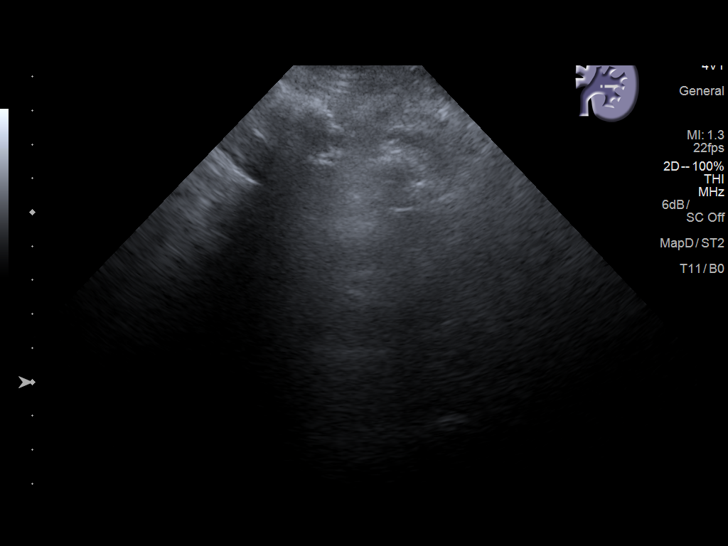
[im 18/22]
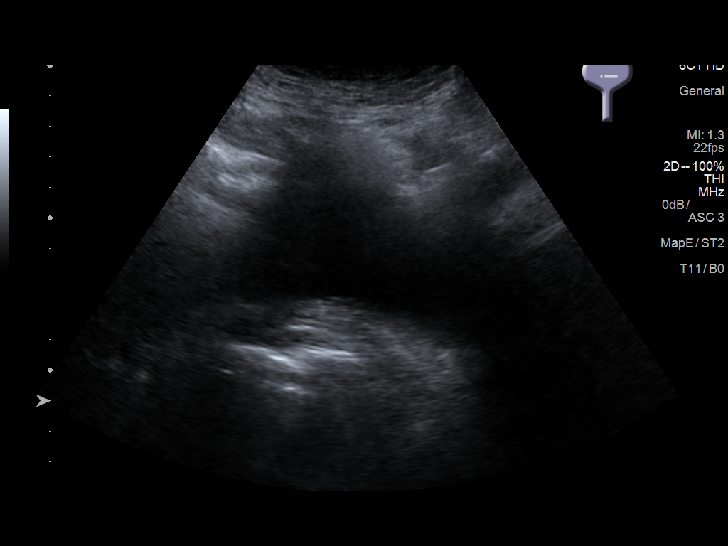
[im 20/22]
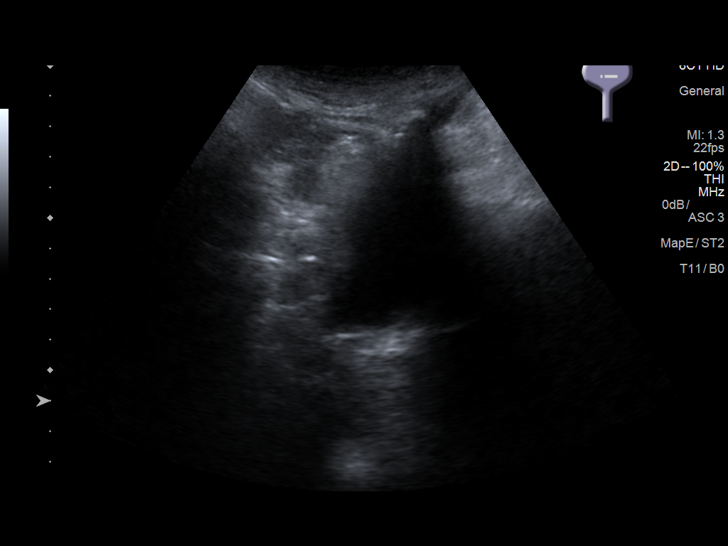
[im 22/22]
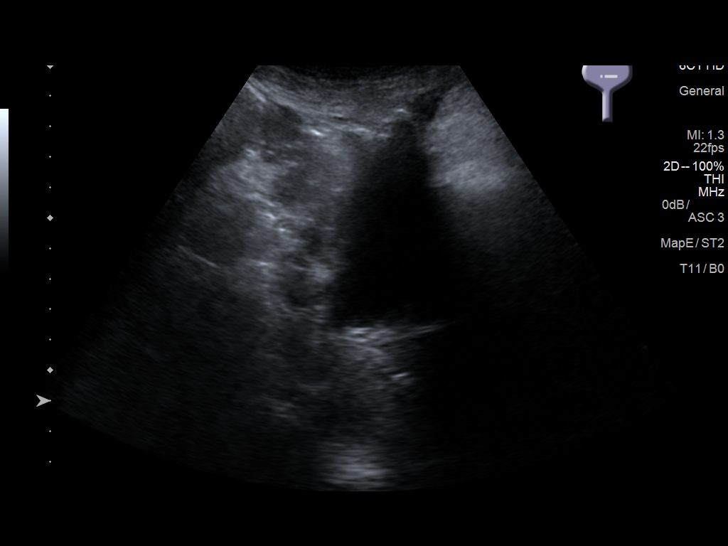

[13 of 22 positions shown; findings below may reference images not displayed]

FINDINGS: Right Kidney:

Renal measurements: 11.6 x 4.2 x 5.1 cm = volume: 127 mL. Solid
x 4.4 x 4.1 cm lower right renal mass, previously 3.9 x 3.9 x
cm. Echogenic right renal parenchyma. No right hydronephrosis.

Left Kidney:

Left kidney could not be visualized on this scan presumably due to
overlying rib and bowel gas shadowing and patient body habitus.

Bladder:

Relatively collapsed and grossly normal bladder.
IMPRESSION: 1. Solid 5.4 cm lower right renal mass, apparently increased in size
from 11/02/2018 sonogram, for which renal cell carcinoma is the
diagnosis of exclusion. Dedicated renal mass protocol MRI or CT
abdomen without and with IV contrast is indicated for further
characterization, and may be performed as clinically warranted.
2. No right hydronephrosis.
3. Echogenic right renal parenchyma indicative of nonspecific renal
parenchymal disease of uncertain chronicity.
4. Nonvisualization of the left kidney due to scan limitations as
detailed. The left kidney could also be evaluated on a renal mass
protocol CT or MR as recommended above.
5. Relatively collapsed and grossly normal bladder.
